# Patient Record
Sex: Male | Born: 1935 | Race: White | Hispanic: No | Smoking: Former smoker
Health system: Southern US, Community
[De-identification: ages and names within clinical notes are randomized; demographics above are authoritative.]

## PROBLEM LIST (undated history)

## (undated) DIAGNOSIS — F0390 Unspecified dementia without behavioral disturbance: Secondary | ICD-10-CM

## (undated) DIAGNOSIS — M797 Fibromyalgia: Secondary | ICD-10-CM

## (undated) DIAGNOSIS — K219 Gastro-esophageal reflux disease without esophagitis: Secondary | ICD-10-CM

## (undated) DIAGNOSIS — F419 Anxiety disorder, unspecified: Secondary | ICD-10-CM

## (undated) DIAGNOSIS — G20 Parkinson's disease: Secondary | ICD-10-CM

## (undated) DIAGNOSIS — I639 Cerebral infarction, unspecified: Secondary | ICD-10-CM

## (undated) DIAGNOSIS — I2510 Atherosclerotic heart disease of native coronary artery without angina pectoris: Secondary | ICD-10-CM

## (undated) DIAGNOSIS — E785 Hyperlipidemia, unspecified: Secondary | ICD-10-CM

## (undated) DIAGNOSIS — J449 Chronic obstructive pulmonary disease, unspecified: Secondary | ICD-10-CM

## (undated) DIAGNOSIS — K920 Hematemesis: Secondary | ICD-10-CM

## (undated) DIAGNOSIS — E039 Hypothyroidism, unspecified: Secondary | ICD-10-CM

## (undated) DIAGNOSIS — A0472 Enterocolitis due to Clostridium difficile, not specified as recurrent: Secondary | ICD-10-CM

## (undated) DIAGNOSIS — R1310 Dysphagia, unspecified: Secondary | ICD-10-CM

## (undated) DIAGNOSIS — N390 Urinary tract infection, site not specified: Secondary | ICD-10-CM

## (undated) HISTORY — PX: CATARACT EXTRACTION: SUR2

---

## 2001-05-29 HISTORY — PX: BACK SURGERY: SHX140

## 2009-04-01 ENCOUNTER — Ambulatory Visit: Payer: Self-pay | Admitting: Family Medicine

## 2010-05-29 DIAGNOSIS — I639 Cerebral infarction, unspecified: Secondary | ICD-10-CM

## 2010-05-29 HISTORY — DX: Cerebral infarction, unspecified: I63.9

## 2011-03-17 ENCOUNTER — Inpatient Hospital Stay: Payer: Self-pay | Admitting: Internal Medicine

## 2011-03-17 DIAGNOSIS — I6789 Other cerebrovascular disease: Secondary | ICD-10-CM

## 2011-04-14 ENCOUNTER — Inpatient Hospital Stay: Payer: Self-pay | Admitting: Internal Medicine

## 2011-05-08 ENCOUNTER — Observation Stay: Payer: Self-pay | Admitting: Internal Medicine

## 2011-06-28 ENCOUNTER — Observation Stay: Payer: Self-pay | Admitting: Internal Medicine

## 2011-06-28 LAB — URINALYSIS, COMPLETE
Bacteria: NONE SEEN
Bilirubin,UR: NEGATIVE
Blood: NEGATIVE
Nitrite: NEGATIVE
Ph: 6 (ref 4.5–8.0)
RBC,UR: 1 /HPF (ref 0–5)
Specific Gravity: 1.021 (ref 1.003–1.030)
Squamous Epithelial: <1
WBC UR: <1 /HPF (ref 0–5)

## 2011-06-28 LAB — COMPREHENSIVE METABOLIC PANEL
Albumin: 3.8 g/dL (ref 3.4–5.0)
Alkaline Phosphatase: 63 U/L (ref 50–136)
Calcium, Total: 9 mg/dL (ref 8.5–10.1)
Co2: 28 mmol/L (ref 21–32)
EGFR (Non-African Amer.): >60
Glucose: 105 mg/dL — ABNORMAL HIGH (ref 65–99)
SGOT(AST): 19 U/L (ref 15–37)
SGPT (ALT): 10 U/L — ABNORMAL LOW
Total Protein: 7.8 g/dL (ref 6.4–8.2)

## 2011-06-28 LAB — CBC
HCT: 42.3 % (ref 40.0–52.0)
HGB: 14.3 g/dL (ref 13.0–18.0)
MCH: 31.7 pg (ref 26.0–34.0)
MCHC: 33.8 g/dL (ref 32.0–36.0)
RBC: 4.52 10*6/uL (ref 4.40–5.90)

## 2011-06-29 LAB — CBC WITH DIFFERENTIAL/PLATELET
Basophil #: 0.1 10*3/uL (ref 0.0–0.1)
HCT: 42.2 % (ref 40.0–52.0)
HGB: 14.6 g/dL (ref 13.0–18.0)
Lymphocyte %: 25.9 %
Monocyte #: 0.8 10*3/uL — ABNORMAL HIGH (ref 0.0–0.7)
Monocyte %: 10.5 %
Neutrophil %: 59.6 %
Platelet: 188 10*3/uL (ref 150–440)
RBC: 4.52 10*6/uL (ref 4.40–5.90)
RDW: 12.7 % (ref 11.5–14.5)
WBC: 7.8 10*3/uL (ref 3.8–10.6)

## 2011-06-29 LAB — BASIC METABOLIC PANEL
BUN: 9 mg/dL (ref 7–18)
Calcium, Total: 8.6 mg/dL (ref 8.5–10.1)
Creatinine: 0.64 mg/dL (ref 0.60–1.30)
EGFR (African American): >60
EGFR (Non-African Amer.): >60
Glucose: 89 mg/dL (ref 65–99)
Potassium: 3.9 mmol/L (ref 3.5–5.1)
Sodium: 142 mmol/L (ref 136–145)

## 2012-10-29 ENCOUNTER — Ambulatory Visit: Payer: Self-pay | Admitting: Otolaryngology

## 2012-11-09 ENCOUNTER — Other Ambulatory Visit: Payer: Self-pay | Admitting: Family Medicine

## 2012-11-09 LAB — URINALYSIS, COMPLETE
Bacteria: NONE SEEN
Blood: NEGATIVE
Glucose,UR: NEGATIVE mg/dL (ref 0–75)
Leukocyte Esterase: NEGATIVE
Nitrite: NEGATIVE
RBC,UR: <1 /HPF (ref 0–5)
Specific Gravity: 1.03 (ref 1.003–1.030)
Squamous Epithelial: <1
WBC UR: 2 /HPF (ref 0–5)

## 2012-11-09 LAB — COMPREHENSIVE METABOLIC PANEL
Albumin: 3.9 g/dL (ref 3.4–5.0)
Anion Gap: 8 (ref 7–16)
Calcium, Total: 8.6 mg/dL (ref 8.5–10.1)
Chloride: 106 mmol/L (ref 98–107)
Co2: 27 mmol/L (ref 21–32)
Creatinine: 0.7 mg/dL (ref 0.60–1.30)
EGFR (Non-African Amer.): >60
Glucose: 49 mg/dL — ABNORMAL LOW (ref 65–99)
Osmolality: 279 (ref 275–301)
Potassium: 5.3 mmol/L — ABNORMAL HIGH (ref 3.5–5.1)
SGOT(AST): 21 U/L (ref 15–37)

## 2012-11-09 LAB — CBC WITH DIFFERENTIAL/PLATELET
Basophil #: 0 10*3/uL (ref 0.0–0.1)
Basophil %: 0.6 %
HCT: 40.8 % (ref 40.0–52.0)
Lymphocyte #: 1.9 10*3/uL (ref 1.0–3.6)
MCHC: 34.6 g/dL (ref 32.0–36.0)
MCV: 92 fL (ref 80–100)
Monocyte #: 0.8 x10 3/mm (ref 0.2–1.0)
Monocyte %: 9.6 %
Neutrophil #: 5 10*3/uL (ref 1.4–6.5)
Neutrophil %: 63.5 %
RBC: 4.46 10*6/uL (ref 4.40–5.90)
RDW: 13.5 % (ref 11.5–14.5)
WBC: 7.9 10*3/uL (ref 3.8–10.6)

## 2012-11-09 LAB — VALPROIC ACID LEVEL: Valproic Acid: 29 ug/mL — ABNORMAL LOW

## 2013-06-12 ENCOUNTER — Emergency Department: Payer: Self-pay | Admitting: Emergency Medicine

## 2013-06-12 LAB — COMPREHENSIVE METABOLIC PANEL
ALBUMIN: 3.4 g/dL (ref 3.4–5.0)
ALK PHOS: 84 U/L
ANION GAP: 6 — AB (ref 7–16)
AST: 16 U/L (ref 15–37)
BILIRUBIN TOTAL: 0.5 mg/dL (ref 0.2–1.0)
BUN: 10 mg/dL (ref 7–18)
CREATININE: 0.75 mg/dL (ref 0.60–1.30)
Calcium, Total: 8.5 mg/dL (ref 8.5–10.1)
Chloride: 103 mmol/L (ref 98–107)
Co2: 28 mmol/L (ref 21–32)
EGFR (Non-African Amer.): >60
Glucose: 121 mg/dL — ABNORMAL HIGH (ref 65–99)
Osmolality: 274 (ref 275–301)
POTASSIUM: 3.9 mmol/L (ref 3.5–5.1)
SGPT (ALT): 9 U/L — ABNORMAL LOW (ref 12–78)
Sodium: 137 mmol/L (ref 136–145)
TOTAL PROTEIN: 7.7 g/dL (ref 6.4–8.2)

## 2013-06-12 LAB — URINALYSIS, COMPLETE
BILIRUBIN, UR: NEGATIVE
BLOOD: NEGATIVE
Bacteria: NONE SEEN
Glucose,UR: NEGATIVE mg/dL (ref 0–75)
Leukocyte Esterase: NEGATIVE
NITRITE: NEGATIVE
PROTEIN: NEGATIVE
Ph: 5 (ref 4.5–8.0)
RBC,UR: 1 /HPF (ref 0–5)
SPECIFIC GRAVITY: 1.017 (ref 1.003–1.030)
Squamous Epithelial: NONE SEEN
WBC UR: 5 /HPF (ref 0–5)

## 2013-06-12 LAB — CBC
HCT: 40.6 % (ref 40.0–52.0)
HGB: 14.3 g/dL (ref 13.0–18.0)
MCH: 32.7 pg (ref 26.0–34.0)
MCHC: 35.1 g/dL (ref 32.0–36.0)
MCV: 93 fL (ref 80–100)
Platelet: 202 10*3/uL (ref 150–440)
RBC: 4.36 10*6/uL — ABNORMAL LOW (ref 4.40–5.90)
RDW: 13.8 % (ref 11.5–14.5)
WBC: 7.3 10*3/uL (ref 3.8–10.6)

## 2013-06-12 LAB — ETHANOL: Ethanol %: <0.003 % (ref 0.000–0.080)

## 2013-06-12 LAB — TROPONIN I: Troponin-I: <0.02 ng/mL

## 2013-06-18 ENCOUNTER — Observation Stay: Payer: Self-pay | Admitting: Internal Medicine

## 2013-06-18 LAB — COMPREHENSIVE METABOLIC PANEL
ALT: 10 U/L — AB (ref 12–78)
AST: 36 U/L (ref 15–37)
Albumin: 3.9 g/dL (ref 3.4–5.0)
Alkaline Phosphatase: 67 U/L
Anion Gap: 7 (ref 7–16)
BILIRUBIN TOTAL: 0.5 mg/dL (ref 0.2–1.0)
BUN: 13 mg/dL (ref 7–18)
CO2: 28 mmol/L (ref 21–32)
CREATININE: 0.82 mg/dL (ref 0.60–1.30)
Calcium, Total: 9.1 mg/dL (ref 8.5–10.1)
Chloride: 104 mmol/L (ref 98–107)
Glucose: 88 mg/dL (ref 65–99)
OSMOLALITY: 277 (ref 275–301)
Potassium: 4.5 mmol/L (ref 3.5–5.1)
Sodium: 139 mmol/L (ref 136–145)
Total Protein: 8.1 g/dL (ref 6.4–8.2)

## 2013-06-18 LAB — URINALYSIS, COMPLETE
BACTERIA: NONE SEEN
BLOOD: NEGATIVE
Bilirubin,UR: NEGATIVE
GLUCOSE, UR: NEGATIVE mg/dL (ref 0–75)
Leukocyte Esterase: NEGATIVE
Nitrite: NEGATIVE
Ph: 7 (ref 4.5–8.0)
Protein: NEGATIVE
RBC,UR: 1 /HPF (ref 0–5)
SPECIFIC GRAVITY: 1.015 (ref 1.003–1.030)
Squamous Epithelial: NONE SEEN
WBC UR: 4 /HPF (ref 0–5)

## 2013-06-18 LAB — CBC WITH DIFFERENTIAL/PLATELET
BASOS PCT: 0.4 %
Basophil #: 0.1 10*3/uL (ref 0.0–0.1)
Eosinophil #: 0.1 10*3/uL (ref 0.0–0.7)
Eosinophil %: 0.7 %
HCT: 44.3 % (ref 40.0–52.0)
HGB: 14.7 g/dL (ref 13.0–18.0)
Lymphocyte #: 1.2 10*3/uL (ref 1.0–3.6)
Lymphocyte %: 9 %
MCH: 31.6 pg (ref 26.0–34.0)
MCHC: 33.2 g/dL (ref 32.0–36.0)
MCV: 95 fL (ref 80–100)
Monocyte #: 1 x10 3/mm (ref 0.2–1.0)
Monocyte %: 7 %
Neutrophil #: 11.3 10*3/uL — ABNORMAL HIGH (ref 1.4–6.5)
Neutrophil %: 82.9 %
Platelet: 208 10*3/uL (ref 150–440)
RBC: 4.66 10*6/uL (ref 4.40–5.90)
RDW: 13.7 % (ref 11.5–14.5)
WBC: 13.7 10*3/uL — ABNORMAL HIGH (ref 3.8–10.6)

## 2013-06-18 LAB — PHENYTOIN LEVEL, TOTAL: Dilantin: 8.5 ug/mL — ABNORMAL LOW (ref 10.0–20.0)

## 2013-06-19 LAB — CBC WITH DIFFERENTIAL/PLATELET
BASOS ABS: 0.1 10*3/uL (ref 0.0–0.1)
Basophil %: 0.6 %
Eosinophil #: 0.1 10*3/uL (ref 0.0–0.7)
Eosinophil %: 1 %
HCT: 42.1 % (ref 40.0–52.0)
HGB: 14.1 g/dL (ref 13.0–18.0)
Lymphocyte #: 1.2 10*3/uL (ref 1.0–3.6)
Lymphocyte %: 11.9 %
MCH: 31.9 pg (ref 26.0–34.0)
MCHC: 33.6 g/dL (ref 32.0–36.0)
MCV: 95 fL (ref 80–100)
Monocyte #: 0.9 x10 3/mm (ref 0.2–1.0)
Monocyte %: 9.2 %
NEUTROS ABS: 7.8 10*3/uL — AB (ref 1.4–6.5)
NEUTROS PCT: 77.3 %
Platelet: 207 10*3/uL (ref 150–440)
RBC: 4.43 10*6/uL (ref 4.40–5.90)
RDW: 14.1 % (ref 11.5–14.5)
WBC: 10.1 10*3/uL (ref 3.8–10.6)

## 2013-06-19 LAB — URINE CULTURE

## 2013-06-19 LAB — BASIC METABOLIC PANEL
Anion Gap: 8 (ref 7–16)
BUN: 10 mg/dL (ref 7–18)
CALCIUM: 8.8 mg/dL (ref 8.5–10.1)
CHLORIDE: 104 mmol/L (ref 98–107)
CREATININE: 0.77 mg/dL (ref 0.60–1.30)
Co2: 25 mmol/L (ref 21–32)
EGFR (Non-African Amer.): >60
Glucose: 88 mg/dL (ref 65–99)
OSMOLALITY: 272 (ref 275–301)
Potassium: 3.8 mmol/L (ref 3.5–5.1)
SODIUM: 137 mmol/L (ref 136–145)

## 2013-06-19 LAB — MAGNESIUM: MAGNESIUM: 2 mg/dL

## 2013-06-20 LAB — PHENYTOIN LEVEL, TOTAL: DILANTIN: 8.1 ug/mL — AB (ref 10.0–20.0)

## 2013-06-21 ENCOUNTER — Emergency Department: Payer: Self-pay | Admitting: Emergency Medicine

## 2013-06-21 ENCOUNTER — Ambulatory Visit: Payer: Self-pay | Admitting: Neurology

## 2013-06-21 LAB — COMPREHENSIVE METABOLIC PANEL
ALK PHOS: 68 U/L
Albumin: 3.5 g/dL (ref 3.4–5.0)
Anion Gap: 7 (ref 7–16)
BUN: 13 mg/dL (ref 7–18)
Bilirubin,Total: 0.4 mg/dL (ref 0.2–1.0)
Calcium, Total: 8.7 mg/dL (ref 8.5–10.1)
Chloride: 106 mmol/L (ref 98–107)
Co2: 27 mmol/L (ref 21–32)
Creatinine: 1 mg/dL (ref 0.60–1.30)
EGFR (African American): >60
Glucose: 156 mg/dL — ABNORMAL HIGH (ref 65–99)
Osmolality: 283 (ref 275–301)
Potassium: 3.6 mmol/L (ref 3.5–5.1)
SGOT(AST): 39 U/L — ABNORMAL HIGH (ref 15–37)
SGPT (ALT): 23 U/L (ref 12–78)
Sodium: 140 mmol/L (ref 136–145)
Total Protein: 7.6 g/dL (ref 6.4–8.2)

## 2013-06-21 LAB — CBC
HCT: 41.8 % (ref 40.0–52.0)
HGB: 14.1 g/dL (ref 13.0–18.0)
MCH: 32.2 pg (ref 26.0–34.0)
MCHC: 33.8 g/dL (ref 32.0–36.0)
MCV: 95 fL (ref 80–100)
PLATELETS: 211 10*3/uL (ref 150–440)
RBC: 4.38 10*6/uL — AB (ref 4.40–5.90)
RDW: 14 % (ref 11.5–14.5)
WBC: 9.6 10*3/uL (ref 3.8–10.6)

## 2013-06-21 LAB — DRUG SCREEN, URINE
Amphetamines, Ur Screen: NEGATIVE (ref ?–1000)
BARBITURATES, UR SCREEN: NEGATIVE (ref ?–200)
Benzodiazepine, Ur Scrn: POSITIVE (ref ?–200)
Cannabinoid 50 Ng, Ur ~~LOC~~: NEGATIVE (ref ?–50)
Cocaine Metabolite,Ur ~~LOC~~: NEGATIVE (ref ?–300)
MDMA (Ecstasy)Ur Screen: NEGATIVE (ref ?–500)
METHADONE, UR SCREEN: NEGATIVE (ref ?–300)
OPIATE, UR SCREEN: POSITIVE (ref ?–300)
PHENCYCLIDINE (PCP) UR S: NEGATIVE (ref ?–25)
Tricyclic, Ur Screen: NEGATIVE (ref ?–1000)

## 2013-06-21 LAB — URINALYSIS, COMPLETE
Bilirubin,UR: NEGATIVE
Glucose,UR: NEGATIVE mg/dL (ref 0–75)
Hyaline Cast: 6
Leukocyte Esterase: NEGATIVE
Nitrite: NEGATIVE
PH: 5 (ref 4.5–8.0)
Protein: 30
RBC,UR: 1 /HPF (ref 0–5)
Specific Gravity: 1.027 (ref 1.003–1.030)
Squamous Epithelial: <1
WBC UR: 6 /HPF (ref 0–5)

## 2013-06-21 LAB — ETHANOL: Ethanol: <3 mg/dL

## 2013-06-21 LAB — VALPROIC ACID LEVEL: Valproic Acid: 13 ug/mL — ABNORMAL LOW

## 2013-06-21 LAB — PHENYTOIN LEVEL, TOTAL: Dilantin: 9.1 ug/mL — ABNORMAL LOW (ref 10.0–20.0)

## 2013-07-27 DIAGNOSIS — A0472 Enterocolitis due to Clostridium difficile, not specified as recurrent: Secondary | ICD-10-CM

## 2013-07-27 DIAGNOSIS — R1310 Dysphagia, unspecified: Secondary | ICD-10-CM

## 2013-07-27 HISTORY — DX: Enterocolitis due to Clostridium difficile, not specified as recurrent: A04.72

## 2013-07-27 HISTORY — DX: Dysphagia, unspecified: R13.10

## 2013-08-07 ENCOUNTER — Other Ambulatory Visit: Payer: Self-pay

## 2013-08-07 ENCOUNTER — Other Ambulatory Visit: Payer: Self-pay | Admitting: Family Medicine

## 2013-08-07 LAB — URINALYSIS, COMPLETE
BACTERIA: NONE SEEN
BLOOD: NEGATIVE
Bilirubin,UR: NEGATIVE
Glucose,UR: NEGATIVE mg/dL (ref 0–75)
Hyaline Cast: 6
Leukocyte Esterase: NEGATIVE
Nitrite: NEGATIVE
Ph: 5 (ref 4.5–8.0)
Protein: 30
RBC,UR: 4 /HPF (ref 0–5)
Specific Gravity: 1.028 (ref 1.003–1.030)
Squamous Epithelial: <1
WBC UR: 5 /HPF (ref 0–5)

## 2013-08-07 LAB — CBC WITH DIFFERENTIAL/PLATELET
Basophil #: 0 10*3/uL (ref 0.0–0.1)
Basophil %: 0.7 %
EOS ABS: 0.2 10*3/uL (ref 0.0–0.7)
Eosinophil %: 3.4 %
HCT: 41.4 % (ref 40.0–52.0)
HGB: 13.3 g/dL (ref 13.0–18.0)
LYMPHS PCT: 24.8 %
Lymphocyte #: 1.1 10*3/uL (ref 1.0–3.6)
MCH: 31.5 pg (ref 26.0–34.0)
MCHC: 32 g/dL (ref 32.0–36.0)
MCV: 98 fL (ref 80–100)
MONO ABS: 0.5 x10 3/mm (ref 0.2–1.0)
MONOS PCT: 10.3 %
Neutrophil #: 2.7 10*3/uL (ref 1.4–6.5)
Neutrophil %: 60.8 %
Platelet: 185 10*3/uL (ref 150–440)
RBC: 4.21 10*6/uL — AB (ref 4.40–5.90)
RDW: 14.5 % (ref 11.5–14.5)
WBC: 4.5 10*3/uL (ref 3.8–10.6)

## 2013-08-07 LAB — COMPREHENSIVE METABOLIC PANEL
ALT: 13 U/L (ref 12–78)
Albumin: 3.6 g/dL (ref 3.4–5.0)
Alkaline Phosphatase: 59 U/L
Anion Gap: 4 — ABNORMAL LOW (ref 7–16)
BUN: 9 mg/dL (ref 7–18)
Bilirubin,Total: 0.4 mg/dL (ref 0.2–1.0)
CO2: 31 mmol/L (ref 21–32)
Calcium, Total: 8.6 mg/dL (ref 8.5–10.1)
Chloride: 106 mmol/L (ref 98–107)
Creatinine: 0.92 mg/dL (ref 0.60–1.30)
EGFR (African American): >60
GLUCOSE: 97 mg/dL (ref 65–99)
Osmolality: 280 (ref 275–301)
Potassium: 3.9 mmol/L (ref 3.5–5.1)
SGOT(AST): 23 U/L (ref 15–37)
Sodium: 141 mmol/L (ref 136–145)
TOTAL PROTEIN: 6.9 g/dL (ref 6.4–8.2)

## 2013-08-07 LAB — VALPROIC ACID LEVEL: Valproic Acid: 54 ug/mL

## 2013-08-09 LAB — URINE CULTURE

## 2013-08-18 ENCOUNTER — Emergency Department: Payer: Self-pay | Admitting: Emergency Medicine

## 2013-08-18 LAB — CBC
HCT: 41.8 % (ref 40.0–52.0)
HGB: 14.2 g/dL (ref 13.0–18.0)
MCH: 32.5 pg (ref 26.0–34.0)
MCHC: 34.1 g/dL (ref 32.0–36.0)
MCV: 95 fL (ref 80–100)
Platelet: 208 10*3/uL (ref 150–440)
RBC: 4.38 10*6/uL — AB (ref 4.40–5.90)
RDW: 14.2 % (ref 11.5–14.5)
WBC: 8.8 10*3/uL (ref 3.8–10.6)

## 2013-08-18 LAB — MAGNESIUM: Magnesium: 2 mg/dL

## 2013-08-18 LAB — URINALYSIS, COMPLETE
BACTERIA: NONE SEEN
BLOOD: NEGATIVE
Bilirubin,UR: NEGATIVE
Glucose,UR: NEGATIVE mg/dL (ref 0–75)
Hyaline Cast: 5
Leukocyte Esterase: NEGATIVE
Nitrite: NEGATIVE
PH: 6 (ref 4.5–8.0)
Protein: 30
SPECIFIC GRAVITY: 1.023 (ref 1.003–1.030)
Squamous Epithelial: NONE SEEN
WBC UR: 1 /HPF (ref 0–5)

## 2013-08-18 LAB — HEPATIC FUNCTION PANEL A (ARMC)
Albumin: 3.7 g/dL (ref 3.4–5.0)
Alkaline Phosphatase: 63 U/L
Bilirubin, Direct: 0.1 mg/dL (ref 0.00–0.20)
Bilirubin,Total: 0.5 mg/dL (ref 0.2–1.0)
SGOT(AST): 25 U/L (ref 15–37)
SGPT (ALT): 13 U/L (ref 12–78)
Total Protein: 7.5 g/dL (ref 6.4–8.2)

## 2013-08-18 LAB — BASIC METABOLIC PANEL
Anion Gap: 8 (ref 7–16)
BUN: 9 mg/dL (ref 7–18)
CALCIUM: 8.7 mg/dL (ref 8.5–10.1)
Chloride: 105 mmol/L (ref 98–107)
Co2: 25 mmol/L (ref 21–32)
Creatinine: 0.97 mg/dL (ref 0.60–1.30)
EGFR (Non-African Amer.): >60
Glucose: 101 mg/dL — ABNORMAL HIGH (ref 65–99)
Osmolality: 275 (ref 275–301)
Potassium: 3.1 mmol/L — ABNORMAL LOW (ref 3.5–5.1)
Sodium: 138 mmol/L (ref 136–145)

## 2013-08-18 LAB — TROPONIN I

## 2013-08-18 LAB — LIPASE, BLOOD: LIPASE: 228 U/L (ref 73–393)

## 2013-09-30 ENCOUNTER — Ambulatory Visit: Payer: Self-pay | Admitting: Family Medicine

## 2013-10-01 ENCOUNTER — Other Ambulatory Visit: Payer: Self-pay | Admitting: Family Medicine

## 2013-10-01 LAB — URINALYSIS, COMPLETE
BILIRUBIN, UR: NEGATIVE
Bacteria: NONE SEEN
Blood: NEGATIVE
Glucose,UR: NEGATIVE mg/dL (ref 0–75)
Leukocyte Esterase: NEGATIVE
Nitrite: NEGATIVE
PH: 6 (ref 4.5–8.0)
Protein: NEGATIVE
RBC,UR: NONE SEEN /HPF (ref 0–5)
Specific Gravity: 1.015 (ref 1.003–1.030)
Squamous Epithelial: NONE SEEN
WBC UR: NONE SEEN /HPF (ref 0–5)

## 2013-10-03 LAB — URINE CULTURE

## 2014-01-16 ENCOUNTER — Emergency Department: Payer: Self-pay | Admitting: Internal Medicine

## 2014-01-16 LAB — TROPONIN I: Troponin-I: <0.02 ng/mL

## 2014-01-16 LAB — COMPREHENSIVE METABOLIC PANEL
ALBUMIN: 3.7 g/dL (ref 3.4–5.0)
ALK PHOS: 80 U/L
Anion Gap: 4 — ABNORMAL LOW (ref 7–16)
BUN: 15 mg/dL (ref 7–18)
Bilirubin,Total: 0.4 mg/dL (ref 0.2–1.0)
CALCIUM: 9.3 mg/dL (ref 8.5–10.1)
CREATININE: 0.88 mg/dL (ref 0.60–1.30)
Chloride: 110 mmol/L — ABNORMAL HIGH (ref 98–107)
Co2: 34 mmol/L — ABNORMAL HIGH (ref 21–32)
EGFR (African American): >60
Glucose: 116 mg/dL — ABNORMAL HIGH (ref 65–99)
Osmolality: 296 (ref 275–301)
Potassium: 3.9 mmol/L (ref 3.5–5.1)
SGOT(AST): 20 U/L (ref 15–37)
SGPT (ALT): 10 U/L — ABNORMAL LOW
Sodium: 148 mmol/L — ABNORMAL HIGH (ref 136–145)
TOTAL PROTEIN: 7.8 g/dL (ref 6.4–8.2)

## 2014-01-16 LAB — PHENYTOIN LEVEL, TOTAL: Dilantin: 7.5 ug/mL — ABNORMAL LOW (ref 10.0–20.0)

## 2014-01-16 LAB — PROTIME-INR
INR: 1.1
PROTHROMBIN TIME: 14.2 s (ref 11.5–14.7)

## 2014-01-16 LAB — CK TOTAL AND CKMB (NOT AT ARMC)
CK, TOTAL: 36 U/L — AB
CK-MB: 1.2 ng/mL (ref 0.5–3.6)

## 2014-01-16 LAB — CBC
HCT: 46.6 % (ref 40.0–52.0)
HGB: 15 g/dL (ref 13.0–18.0)
MCH: 30.5 pg (ref 26.0–34.0)
MCHC: 32.3 g/dL (ref 32.0–36.0)
MCV: 94 fL (ref 80–100)
Platelet: 213 10*3/uL (ref 150–440)
RBC: 4.93 10*6/uL (ref 4.40–5.90)
RDW: 13.8 % (ref 11.5–14.5)
WBC: 7.3 10*3/uL (ref 3.8–10.6)

## 2014-09-19 NOTE — Discharge Summary (Signed)
PATIENT NAME:  Nicholas PalmaFOGLEMAN, Jethro E MR#:  [id number] DATE OF BIRTH:  1936/02/27  DATE OF ADMISSION:  06/18/2013 DATE OF DISCHARGE:  06/20/2013  DISCHARGE DIAGNOSES: 1.  Seizures secondary to subtherapeutic phenytoin levels.  2.  Postictal encephalopathy.  3.  Delirium over dementia.   IMAGING STUDIES: Include a CT scan of the head without contrast which showed no acute infarction, mass, or bleed. He did have mild soft tissue swelling over the right frontal calvarium. Did how significant atrophy.   CT of the cervical spine without contrast showed no dislocation or fracture. It did show significant degenerative disease.   MRI of the brain without contrast showed chronic small vessel ischemic disease. Cerebral atrophy. No acute abnormalities or strokes.   CONSULTS: Dr. Malvin JohnsPotter with neurology.   ADMITTING HISTORY AND PHYSICAL AND HOSPITAL COURSE: Please see detailed H and P dictated by Dr. Randol KernElgergawy.  In brief, a 79 year old Caucasian male patient with history of Parkinson's, CVA, dementia, and recent seizures who presented to the hospital with recurrent seizures and confusion. The patient was found to have subtherapeutic Dilantin levels, admitted to the hospitalist service for further treatment and work-up. The patient had a CT scan of the head and MRI of the brain done which showed no significant abnormalities other than chronic small vessel disease and cerebral atrophy. The patient was seen by Dr. Malvin JohnsPotter who suggested increasing his phenytoin level to 400 mg daily after which the patient was switched to 200 mg twice a day orally. The patient has not had any seizures in the hospital. He did have prolonged postictal encephalopathy which has resolved. I have discussed the case with his wife at bedside on the day of discharge and she feels that the patient is back to his baseline. He does have significant dementia, has episodes of confusion.   Today on examination, the patient is alert to person but not to  place or time. Moves all 4 extremities symmetrically.   DISCHARGE MEDICATIONS: Include:  1.  Norvasc 2.5 mg daily.  2.  Aspirin 81 mg daily.  3.  Plavix 75 mg daily.  4.  Prilosec 40 mg daily.  5.  Mylanta 30 mL every 2 hours as needed.  6.  Zoloft 100 mg daily.  7.  Xanax 0.5 mg oral 2 times a day as needed for agitation.  8.  Requip 1 mg oral 3 times a day.  9.  Aricept 10 mg daily at bedtime.  10.  Carbidopa/levodopa 50/200 mg 1 tablet 2 times a day.  11.  Depakote 250 mg in the morning and 250 mg at bedtime.  12.  Melatonin 3 mg oral once at bedtime.  13.  Norco 325/5 mg 1 tablet 2 times a day as needed for pain.  14.  Dilantin 200 mg oral 2 times a day.   DISCHARGE INSTRUCTIONS: Low-sodium diet. Activity as tolerated with assistance. The patient will be on a pureed, nectar thickened liquid diet. Follow up with Dr. Sherryll BurgerShah with neurology in 1 to 2 weeks. Also add Magic cup 3 times a day with the patient's meals.   TIME SPENT: On day of discharge in discharge activity was 35 minutes. ____________________________ Molinda BailiffSrikar R. Christella App, MD srs:sb D: 06/20/2013 14:56:36 ET T: 06/20/2013 15:06:01 ET JOB#: [id number]  cc: Wardell HeathSrikar R. Kamisha Ell, MD, <Dictator> Hemang K. Sherryll BurgerShah, MD Orie FishermanSRIKAR R Hassan Blackshire MD ELECTRONICALLY SIGNED 06/24/2013 10:17

## 2014-09-19 NOTE — Consult Note (Signed)
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  He was recently admitted to [facility] with a prior episode of seizures as well during which time he was started on Dilantin.  In the ED the patient was seen to be very agitated and was very haldol and ativan.  His dilantin level was found to be low at Nicholas.5.  He had a HCT performed which was unremarkable.  Since his seizures, per family at bedside, the patient has been rather agitated and also drowsy.  He is not back to his current level of cognitive function.  No new or additional seizure activity witnessed since admission.  No clear cause has been identified for his seizures.  No seizures before last week.  At baseline he doesnt know the year but does recognize his wife and know her name.  Unable to perform due to AMS. MEDICAL HISTORY: History of CVA.  Hypertension.  COPD.  Anxiety and depression.  Parkinson disease with tremors. History of dementia.  Hyperlipidemia.  GERD.  SURGICAL HISTORY: Back surgery.  Cataract surgery. HISTORY:  patient lives at a skilled nursing facility.  quit smoking in 2012.  alcohol.  recreational drug use.  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  MEDICATIONS: Aspirin 81 mg daily.  Norco 2 times a day as needed.  Ultracet as needed.  MiraLAX as needed.  Mylanta as needed.  Dilantin 300 mg oral at bedtime.  Depakote sprinkles 250 mg oral daily.  Zoloft 100 mg oral daily.  Carbidopa, levodopa 50/200 extended release 2 times a day.  Requip 1 mg oral 3 times a day.  Plavix 75 mg oral daily.  Xanax 0.5 mg oral 2 times a day as needed.  Norvasc 2.5 mg oral daily.  Aricept 10 mg oral at bedtime.  Cleocin 300 mg oral every eight  hours.  Melatonin 3 mg oral at bedtime.  Prilosec 40 mg oral daily.   ALLERGIES:      (Removed):  Past Medical/Surgical Hx:  depression:   anxiety:   CVA: 02/2011  COPD:   Parkinson's Dis.:   dysphagia: dx'd previous admission  Cataract Extraction:   Back Surgery:   Home Medications: Medication Instructions Last Modified Date/Time  Cleocin HCl 300 mg oral capsule 1 cap(s) orally every Nicholas hours 21-Jan-15 01:56  Dilantin 100 mg oral capsule, extended release 3 cap(s) orally once a day (at bedtime) 21-Jan-15 01:56  Mylanta 30 milliliter(s) orally every 2 hours, As Needed - for Indigestion, Heartburn 21-Jan-15 01:56  Zoloft 100 mg oral tablet 1 tab(s) orally once a day 21-Jan-15 01:56  Xanax 0.5 mg oral tablet 1 tab(s) orally 2 times a day, As Needed - for Anxiety, Nervousness 21-Jan-15 01:56  Requip 1 mg oral tablet 1 tab(s) orally 3 times a day 21-Jan-15 01:56  Ultracet 325 mg-37.5 mg oral tablet  orally 2 times a day 21-Jan-15 01:56  Ultracet 325 mg-37.5 mg oral tablet  orally every 6 hours, As Needed - for Pain 21-Jan-15 01:56  Aricept 10 mg oral tablet 1 tab(s) orally once a day (at bedtime) 21-Jan-15 01:56  carbidopa-levodopa 50 mg-200 mg oral tablet, extended release  orally 2 times a day 21-Jan-15 01:56  Depakote Sprinkles 125 mg oral delayed release capsule 2 cap(s) orally once a day (at bedtime) 21-Jan-15 01:56  Depakote Sprinkles 250 milligram(s) orally once a day 21-Jan-15 01:56  Melatonin 3 mg oral tablet 1 tab(s) orally once (at bedtime) 21-Jan-15 01:56  Milantex Maximum Strength 400 mg-400 mg-40 mg/5 mL oral suspension 30 milliliter(s) orally every Nicholas hours, As Needed - for Indigestion, Heartburn 21-Jan-15 01:56  Norco 325 mg-5 mg oral tablet  orally 2 times a day, As Needed - for Pain 21-Jan-15 01:56  Norvasc 2.5 mg oral tablet 1 tab(s) orally once a day 21-Jan-15 01:56  aspirin 81 mg oral tablet 1 tab(s) orally once a day 21-Jan-15 01:56  Plavix 75 mg oral tablet 1  tab(s) orally once a day 21-Jan-15 01:56  Prilosec 40 mg oral delayed release capsule 1 cap(s) orally once a day 21-Jan-15 01:56   [facility] Current Meds:  Sodium Chloride 0.9%, 1000 ml at 75 ml/hr  Acetaminophen * tablet, ( Tylenol (325 mg) tablet)  650 mg Oral q4h PRN for pain or temp. greater than 100.4  - Indication: Pain/Fever  HePARin injection, 5000 unit(s), Subcutaneous, q8h  Indication: Anticoagulant, Monitor Anticoags per hospital protocol  MorphINE  injection, 2 to 4 mg, IV push, q4h PRN for pain  Indication: Pain, [Med Admin Window: 30 mins before or after scheduled dose]  Ondansetron injection, ( Zofran injection )  4 mg, IV push, q4h PRN for Nausea/Vomiting  Indication: Nausea/ Vomiting  Promethazine injection, ( Phenergan injection )  6.25 to 12.5 mg, Intramuscular, q4h PRN for nausea, vomiting  Indication: Antiemetic/ Motion Sickness/ Sedative/ Antihistamine  LORazepam injection,  ( Ativan injection )  2 mg, IV push, q4h PRN for seizure.  Stop After:  3 Doses  Indication: Anxiety/ Seizure/ Antiemetic Adjunct/ Preop Sedation  Haloperidol injection, ( Haldol injection )  1 to 2 mg, IV push, q4h PRN for agitation.  Stop After:  3 Doses  Indication: Psychosis/ Delirium, agitation  Nursing Saline Flush, 3 to 6 ml, IV push, Q1M PRN for IV Maintenance  Phenytoin injection, ( Dilantin injection )  125 mg in Sodium Chloride 0.9% 50 ml, IV Piggyback, q8h, Infuse over 15 minute(s)  Indication: Seizures, [Waste Code: Black], **Use an inline 0.22 micron filter**Flush line with 10 ml Normal Saline after each dose  DO NOT REFRIGERATE--ADMINISTER WITHIN 2 HRS AFTER PREPARATION  Allergies:  Penicillin: Unknown  Vital Signs: **Vital Signs.:   21-Jan-15 16:55  Vital Signs Type Admission  Temperature Temperature (F) 98.7  Celsius 37  Pulse Pulse 88  Respirations Respirations 20  Systolic BP Systolic BP 811  Diastolic BP (mmHg) Diastolic BP (mmHg) 72  Mean BP 94  Pulse Ox %  Pulse Ox % 93  Pulse Ox Activity Level  At rest  Oxygen Delivery Room Air/ 21 %    22:21  Vital Signs Type Routine  Temperature Temperature (F) 98.9  Celsius 37.1  Pulse Pulse 113  Respirations Respirations 19  Systolic BP Systolic BP 572  Diastolic BP (mmHg) Diastolic BP (mmHg) 82  Mean BP 105  Pulse Ox % Pulse Ox % 94  Pulse Ox Activity Level  At rest  Oxygen Delivery Room Air/ 21 %   EXAM: GENERAL: Confused.  Agitated.  Lethargic.  Sitter at bedside, wife at other side.  Normocephalic and atraumatic.  EYES: Funduscopic exam shows normal disc size, appearance and C/D ratio without clear evidence of papilledema.  CARDIOVASCULAR: S1 and S2 sounds are within normal limits, without murmurs, gallops, or rubs.  MUSCULOSKELETAL: Bulk - Normal Tone - Normal Pronator Drift - Does not follow commands due to AMS. Ambulation - On falls precautions, cannot test due to AMS. Moving all four extremities, at least 4+/5 throughout, does not follow commands due to AMS.  NEUROLOGICAL: MENTAL STATUS: Patient is oriented to name only.  Recent and remote memory are markedly diminished.  Attention span and concentration are markedly diminished.  Naming, repetition, comprehension and expressive speech are unremarkable.  Patient's fund of knowledge cannot be tested due to AMS.  CRANIAL NERVES: Does not follow commands for any visual fields testing.  SENSATION: Grossly intact to pain and temp bilaterally (spinothalamic tracts) Grossly intact to position and vibration bilaterally (dorsal columns)   REFLEXES: R/L 2+/2+    Biceps 2+/2+    Brachioradialis   2+/2+    Patellar 2+/2+    Achilles   COORDINATION/CEREBELLAR: Finger to nose testing cannot be performed due to AMS.  Lab Results:  Hepatic:  21-Jan-15 00:11   Bilirubin, Total 0.5  Alkaline Phosphatase 67 (45-117 NOTE: New Reference Range 04/18/13)  SGPT (ALT)  10  SGOT (AST) 36  Total Protein, Serum Nicholas.1  Albumin, Serum 3.9   TDMs:  21-Jan-15 00:11   Dilantin, Serum  Nicholas.5 (Result(s) reported on 18 Jun 2013 at 12:36AM.)  Routine Micro:  21-Jan-15 08:23   Micro Text Report URINE CULTURE   COMMENT                   NO GROWTH IN 18-24 HOURS   ANTIBIOTIC                       Specimen Source IN AND OUT CATH  Culture Comment NO GROWTH IN 18-24 HOURS  Result(s) reported on 19 Jun 2013 at 12:08PM.  Cardiology:  20-Jan-15 22:12   Ventricular Rate 95  Atrial Rate 95  P-R Interval 158  QRS Duration 84  QT 370  QTc 464  P Axis 74  R Axis -Nicholas  T Axis 58  ECG interpretation Normal sinus rhythm Inferior infarct , age undetermined Abnormal ECG When compared with ECG of 12-Jun-2013 19:27, Inferior infarct is now Present ----------unconfirmed---------- Confirmed by OVERREAD, NOT ([id number]), editor PEARSON, BARBARA ([id number]) on 06/18/2013 12:29:43 PM  Routine Chem:  21-Jan-15 00:11   Glucose, Serum 88  BUN 13  Creatinine (comp) 0.82  Sodium, Serum 139  Potassium, Serum 4.5  Chloride, Serum 104  CO2, Serum 28  Calcium (Total), Serum 9.1  Anion Gap 7  Osmolality (calc) 277  eGFR (African American) >60  eGFR (Non-African American) >60 (eGFR values <33mL/min/1.73 m2 may be an indication of chronic kidney disease (CKD). Calculated eGFR is useful in patients with stable renal function. The eGFR calculation will not be reliable in acutely ill patients when serum creatinine is changing rapidly. It is not useful in  patients on dialysis. The eGFR calculation may not be applicable to patients at the low and high extremes of body sizes, pregnant women, and vegetarians.)  Result Comment POTASSIUM/AST - Slight hemolysis, interpret results with  - caution.  Result(s) reported on 18 Jun 2013 at 01:03AM.  22-Jan-15 04:18   Glucose, Serum 88  BUN 10  Creatinine (comp) 0.77  Sodium, Serum 137  Potassium, Serum 3.Nicholas  Chloride, Serum 104  CO2, Serum 25  Calcium (Total), Serum Nicholas.Nicholas  Anion Gap Nicholas  Osmolality (calc) 272  eGFR  (African American) >60  eGFR (Non-African American) >60 (eGFR values <66mL/min/1.73 m2 may be an indication of  chronic kidney disease (CKD). Calculated eGFR is useful in patients with stable renal function. The eGFR calculation will not be reliable in acutely ill patients when serum creatinine is changing rapidly. It is not useful in  patients on dialysis. The eGFR calculation may not be applicable to patients at the low and high extremes of body sizes, pregnant women, and vegetarians.)  Magnesium, Serum 2.0 (1.Nicholas-2.4 THERAPEUTIC RANGE: 4-7 mg/dL TOXIC: > 10 mg/dL  -----------------------)  Routine UA:  21-Jan-15 08:23   Color (UA) Yellow  Clarity (UA) Clear  Glucose (UA) Negative  Bilirubin (UA) Negative  Ketones (UA) Trace  Specific Gravity (UA) 1.015  Blood (UA) Negative  pH (UA) 7.0  Protein (UA) Negative  Nitrite (UA) Negative  Leukocyte Esterase (UA) Negative (Result(s) reported on 18 Jun 2013 at 08:49AM.)  RBC (UA) 1 /HPF  WBC (UA) 4 /HPF  Bacteria (UA) NONE SEEN  Epithelial Cells (UA) NONE SEEN  Mucous (UA) PRESENT (Result(s) reported on 18 Jun 2013 at 08:49AM.)  Routine Hem:  21-Jan-15 00:11   WBC (CBC)  13.7  RBC (CBC) 4.66  Hemoglobin (CBC) 14.7  Hematocrit (CBC) 44.3  Platelet Count (CBC) 208  MCV 95  MCH 31.6  MCHC 33.2  RDW 13.7  Neutrophil % 82.9  Lymphocyte % 9.0  Monocyte % 7.0  Eosinophil % 0.7  Basophil % 0.4  Neutrophil #  11.3  Lymphocyte # 1.2  Monocyte # 1.0  Eosinophil # 0.1  Basophil # 0.1 (Result(s) reported on 18 Jun 2013 at 12:36AM.)  22-Jan-15 04:18   WBC (CBC) 10.1  RBC (CBC) 4.43  Hemoglobin (CBC) 14.1  Hematocrit (CBC) 42.1  Platelet Count (CBC) 207  MCV 95  MCH 31.9  MCHC 33.6  RDW 14.1  Neutrophil % 77.3  Lymphocyte % 11.9  Monocyte % 9.2  Eosinophil % 1.0  Basophil % 0.6  Neutrophil #  7.Nicholas  Lymphocyte # 1.2  Monocyte # 0.9  Eosinophil # 0.1  Basophil # 0.1 (Result(s) reported on 19 Jun 2013 at 04:57AM.)    Radiology Results: MRI:    22-Jan-15 14:14, MRI Brain Without Contrast  MRI Brain Without Contrast   REASON FOR EXAM:    encephalopathy. CVA?  COMMENTS:       PROCEDURE: MR  - MR BRAIN WO CONTRAST  - Jun 19 2013  2:14PM     CLINICAL DATA:  Encephalopathy. Possible stroke. Prior stroke and  recent head injury.    EXAM:  MRI HEAD WITHOUT CONTRAST    TECHNIQUE:  Multiplanar, multiecho pulse sequences of the brain and surrounding  structures were obtained without intravenous contrast.  COMPARISON:  Head CT 06/17/2013 and brain MRI 06/29/2011    FINDINGS:  There is no evidence of acute infarct. Thereis no intracranial  hemorrhage. Remote lacunar infarcts are again noted within the  posterior limb of the left internal capsule and left corona radiata.  Periventricular white matter T2 hyperintensities are nonspecific but  compatible with mild to moderate chronic small vessel ischemic  disease, unchanged. There is moderate cerebral atrophy, stable to  slightly progressed from the prior MRI. Major intracranial vascular  flow voids are unremarkable. Prior bilateral cataract surgery is  noted. Visualized paranasal sinuses and mastoid air cells are clear.     IMPRESSION:  No evidence of acute intracranial abnormality. Moderate chronic  small vessel ischemic disease and cerebral atrophy.      Electronically Signed    By: Logan Bores    On: 06/19/2013 16:23  Verified By: Ferol Luz, M.D.,  CT:    21-Jan-15 00:07, CT Head Without Contrast  CT Head Without Contrast   REASON FOR EXAM:    pain following trauma  COMMENTS:       PROCEDURE: CT  - CT HEAD WITHOUT CONTRAST  - Jun 18 2013 12:07AM     CLINICAL DATA:  Headache and neck pain, status post trauma.    EXAM:  CT HEAD WITHOUT CONTRAST    CT CERVICAL SPINE WITHOUT CONTRAST    TECHNIQUE:  Multidetector CT imaging of the head and cervical spine was  performed following the standard protocol without  intravenous  contrast. Multiplanar CT image reconstructions of the cervical spine  were also generated.    COMPARISON:  CT HEAD W/O CM dated 06/12/2013; CT NECK WO/W CM dated  10/29/2012; MR HEAD W/O CM dated 06/29/2011    FINDINGS:  CT HEAD FINDINGS    There is no evidence of acute infarction, mass lesion, or intra- or  extra-axial hemorrhage on CT.    Prominence ofthe ventricles and sulci reflects mild cortical volume  loss. Scattered periventricular and subcortical white matter change  likely reflects small vessel ischemic microangiopathy. A small  chronic lacunar infarct is noted at the posterior limb of theleft  internal capsule.    The brainstem and fourth ventricle are within normal limits. The  cerebral hemispheres demonstrate grossly normal gray-white  differentiation. No mass effect or midline shift is seen.    There is no evidence of fracture; visualized osseous structures are  unremarkable in appearance. The orbits are within normal limits. The  paranasal sinuses and mastoid air cells are well-aerated. Mild soft  tissue swelling is noted overlying the right frontal calvarium.    CT CERVICAL SPINE FINDINGS    There is no evidence of acute fracture or subluxation. Vertebral  bodies demonstrate normal height. There is multilevel disc space  narrowing along the lower cervical spine, with grade 1  anterolisthesis of C7 on T1, reflecting underlying facet disease.  Prevertebral soft tissues are within normal limits.    The thyroid gland is unremarkable in appearance. The visualized lung  apices are clear. Calcification is noted at the carotid bifurcations  bilaterally. Asymmetric prominenceof the fat at the right side of  the neck, with mildly prominent lymph nodes and underlying  inflammation, is grossly unchanged from the prior CT of the neck  from 10/29/2012.     IMPRESSION:  1. No evidence of traumatic intracranial injury or fracture.  2. No evidence of fracture or  subluxation along the cervical spine.  3. Mild soft tissue swelling noted overlying the right frontal  calvarium.  4. Mild cortical volume loss and scattered small vessel ischemic  microangiopathy; small chronic lacunar infarct at the posterior limb  of the left internal capsule.  5. Degenerative change noted along the lower cervical spine.  6. Calcification noted at the carotid bifurcations bilaterally.  7. Grossly stable appearance to asymmetric prominence of the fat at  the right side of the neck, containing chronic soft tissue  inflammation and mildly prominent lymph nodes. The presence of lymph  nodes and musculature within this region suggests a chronic soft  tissue inflammatory process rather than a mass.      Electronically Signed    By: Garald Balding M.D.    On: 06/18/2013 00:15         Verified By: JEFFREY . Radene Knee, M.D.,   Impression/Recommendations: Recommendations:   Nicholas year old man with  Alzheimers Fowler presents from his nursing home due to a seizure.  with Dilantin low at Nicholas.5.  Rec increase daily dose to dilantin extended release 400 mg qhs.  Unclear etiology for seizures starting new over the past few weeks.  Rec routine EEG.  Brain MRI personally viewed shows moderate diffuse atrophy but otherwise is unremarkable, no strokes or other lesions.  He is currently agitated most likely due to post ictal state.  Likely that this state will gradually improve over the next few days.  Given low Dilantin, I do not think that high dilantin may be causing or contributing to his excessive confusion, lethargy and agitation, much more likely to be from post ictal state with minimal brain reserve from underlying dementia.  Continue on depakote at same dose.  Patient does not have fever.  UA negative for UTI.  EKG unremarkable.   have reviewed the results of the most recent imaging studies, tests and labs as outlined above and answered all related questions. have personally viewed the  patient's Brain MRI and it is nonfocal. and coordinated plan of care with hospitalist.     Electronic Signatures: Anabel Bene (MD)  (Signed 22-Jan-15 23:31)  Authored: REFERRING PHYSICIAN, Primary Care Physician, Consult, History of Present Illness, Review of Systems, PAST MEDICAL/SURGICAL HISTORY, HOME MEDICATIONS, Current Medications, ALLERGIES, NURSING VITAL SIGNS, Physical Exam-, LAB RESULTS, RADIOLOGY RESULTS, Recommendations   Last Updated: 22-Jan-15 23:31 by Anabel Bene (MD)

## 2014-09-19 NOTE — Consult Note (Signed)
PATIENT NAME:  Nicholas Fowler, Nicholas Fowler MR#:  [id number] DATE OF BIRTH:  01/20/1936  DATE OF CONSULTATION:  06/21/2013  REFERRING PHYSICIAN:  Dr. Margarita Grizzle in the Emergency Room. CONSULTING PHYSICIAN:  Weston Settle, MD  REASON FOR CONSULTATION: Seizures.   HISTORY OF PRESENT ILLNESS: The patient is a 79 year old white male who was admitted from the nursing home to the ER with 2 witnessed generalized tonic-clonic seizures resulting in head and ear trauma. He has had about 3 other generalized tonic-clonic seizures over the last 3 weeks as well, requiring multiple outpatient ER visits as well as inpatient admission. This is a new onset seizure disorder from 3 weeks ago. He has a history of parkinsonism and dementia. The family is not clear whether he has Lewy body dementia or not, and they have never heard of that term. MRI of the brain was performed on June 19, 2013, which I have reviewed personally. It indicates global atrophy. There is evidence of an old left basal ganglia infarct. There is evidence of mild periventricular small vessel ischemic disease. There is also evidence of an old right anterior parietal and an old left parietal cortical infarct. No hemorrhages were present. On today's labs, his complete metabolic panel was normal except for a glucose of 156. Dilantin level is low at 9.1, and he is on 400 mg per day of that. Valproic acid is low at 13, but he has been on that for mood stabilization for 4 years. Urine drug screen is positive for benzodiazepines and opiates, both of which he was taking on a regular basis. CBC is normal. Urinalysis is normal except for 6 white blood cells, but negative nitrites or leukocyte esterase.   PAST MEDICAL HISTORY: 1.  Parkinsonism.  2.  Dementia.  3.  Strokes.  4.  Depression.  5.  Hypertension.  6.  Anxiety.   PAST SURGICAL HISTORY: Negative.   CURRENT MEDICATIONS AT HOME:  1.  Zoloft 100 mg daily.  2.  Requip 1 mg t.i.d.  3.  Prilosec 40 mg q.  daily.  4.  Aricept 10 mg daily.  5.  Valproic acid 250 mg b.i.d.  6.  Dilantin 200 mg b.i.d.  7.  Plavix 75 mg daily.  8.  Sinemet 50/200 b.i.d.  9.  Aspirin 81 mg daily.  10.  Norvasc 2.5 mg daily.  11.  Xanax 0.5 mg b.i.d.  12.  Vicodin p.r.n.  13.  The patient did receive 500 mg of fosphenytoin before I arrived to see him in the Emergency Room.   ALLERGIES: No known drug allergies.   SOCIAL HISTORY: No history of smoking, alcohol or illicit drugs.   FAMILY HISTORY: Noncontributory.   REVIEW OF SYSTEMS: Unobtainable because of the patient's dementia.   PHYSICAL EXAMINATION: VITAL SIGNS: His blood pressure is 113/80, pulse of 93, temperature 98.9. Pulse oximetry is 98% on room air.  HEART: Regular rate and rhythm, S1, S2. No murmurs.  LUNGS: Clear to auscultation.  NECK: There are no carotid bruits.  NEUROLOGIC: He is awake and alert. He is fluent. He is disoriented to the year, or the president, or the month, or the date or the day of the week, but he does know that he is in the hospital and he knows that he is in [place]. His repetition is somewhat impaired. His naming is intact. Extraocular movements are intact. Pupils are equal. Face is symmetrical. Tongue is midline. Palate raises symmetrically. Strength is 5 out of 5 bilaterally in upper and lower extremities in  all muscle groups. Reflexes are +2 and symmetrical. Sensation is intact to all modalities. Coordination is fully intact. There is no Babinski sign. There is no Hoffmann sign. There are no skin rashes. There is a contusion in his forehead as well as in the left auricular area from prior seizures. Gait testing was not performed due to safety reasons.   IMPRESSION AND PLAN: This is a 79 year old who has developed new-onset partial seizures with secondary generalization due to his underlying dementia process. I suspect he probably has Lewy body dementia causing seizures. However, if he has purely Parkinson's disease and then  he has Alzheimer's disease on top of that, that is also a factor for seizure initiation. Another factor could be the cortical stroke that he has had in the past, which could be a nidus for seizure generation. Dilantin has many significant adverse effects in the long run and therefore, I do not recommend continuing that as an outpatient. It may contribute to cerebellar atrophy as well as cause peripheral neuropathy, both of which can impair his gait, and that could be disastrous in someone with concomitant parkinsonism. He has been on Depakote for mood stabilization, and I will keep that the same for now since we do not want to make too many changes at one time. However, that drug is hepatotoxic and can cause hyperammonemia in the elderly, which can lead to some encephalopathy and changes like that which may not be desirable. Nevertheless, we will continue it in the short term. The patient will receive 200 mg of IV Vimpat load here. He will then start Vimpat 100 mg b.i.d. starting tomorrow. After one week, this should be titrated up to 200 mg b.i.d., which is the maximum maintenance dose. He will continue that in combination with Depakote, and depending on how he does clinically, further assessment can be made to adding another adjunctive therapy if necessary.   ____________________________ Weston SettleShervin Vastie Douty, MD se:jcm D: 06/21/2013 15:27:09 ET T: 06/21/2013 17:39:28 ET JOB#: [id number]  cc: Weston SettleShervin Ranesha Val, MD, <Dictator> Weston SettleSHERVIN Temiloluwa Laredo MD ELECTRONICALLY SIGNED 06/22/2013 10:51

## 2014-09-19 NOTE — H&P (Signed)
PATIENT NAME:  Nicholas Fowler, Nicholas Fowler MR#:  [id number] DATE OF BIRTH:  Aug 07, 1935  DATE OF ADMISSION:  06/17/2013  REFERRING PHYSICIAN:  Dr. Marjean Donna.  PRIMARY CARE PHYSICIAN:  Dr.  Lovie Macadamia.   PRIMARY NEUROLOGIST:  Dr. Jennings Books.   CHIEF COMPLAINT:  Seizures and altered mental status.   HISTORY OF PRESENT ILLNESS:  This is a 79 year old male with significant past medical history of Parkinson, CVA, dementia, vascular versus Alzheimer's, bedridden, ambulates only with a wheelchair, lives at a nursing home, the patient was sent from the nursing home today due to episode of seizure and altered mental status, the patient was last week here in [place] [facility] ED as he presented with seizures, the patient was sent home on by mouth Dilantin, the patient is known to have history of multiple falls as well, was sent from nursing home today for what he states fall and was noticed to have episodes of seizures, apparently on presentation to ED the patient was noticed to be extremely agitated and combative where he required multiple Ativan injections and Haldol and Benadryl to calm him down, the patient's CT brain did not show any acute findings, his Dilantin level was on the lower side 8.5, the patient's family at bedside, son and wife, and they gave most of the history, the patient had an episode of agitation in the past at the nursing home, but not that severe, where he was been on Depakote for that, the patient has been followed by Dr. Manuella Ghazi for his Parkinson and dementia and history of CVA in the past, currently, the patient is lethargic due to sedation with the medications he took, and cannot give any history and cooperate with the physical exam.   PAST MEDICAL HISTORY: 1.  History of CVA.  2.  Hypertension.  3.  COPD.  4.  Anxiety and depression.  5.  Parkinson disease with tremors. 6.  History of dementia.  7.  Hyperlipidemia.  8.  GERD.   PAST SURGICAL HISTORY: 1.  Back surgery.  2.  Cataract  surgery.  ALLERGIES:  PENICILLIN.   SOCIAL HISTORY:  The patient lives at a skilled nursing facility.  He quit smoking in 2012.  No alcohol.  No recreational drug use.   FAMILY HISTORY:  Significant for brain tumor in his mother and father dying in his 21s from CVA.   HOME MEDICATIONS: 1.  Aspirin 81 mg daily.  2.  Norco 2 times a day as needed.  3.  Ultracet as needed.  4.  MiraLAX as needed.  5.  Mylanta as needed.  6.  Dilantin 300 mg oral at bedtime.  7.  Depakote sprinkles 250 mg oral daily.  8.  Zoloft 100 mg oral daily.  9.  Carbidopa, levodopa 50/200 extended release 2 times a day.  10.  Requip 1 mg oral 3 times a day.  11.  Plavix 75 mg oral daily.  12.  Xanax 0.5 mg oral 2 times a day as needed.  13.  Norvasc 2.5 mg oral daily.  14.  Aricept 10 mg oral at bedtime.  15.  Cleocin 300 mg oral every eight hours.  16.  Melatonin 3 mg oral at bedtime.  17.  Prilosec 40 mg oral daily.   REVIEW OF SYSTEMS:  Unable to obtain as currently the patient is lethargic and at baseline he has advanced dementia, cannot give any history.   PHYSICAL EXAMINATION: VITAL SIGNS:  Pulse 98, respiratory rate 18, blood pressure 159/84.  Saturating 98% on  room air.  GENERAL:  Well-nourished elderly male sleeps comfortable in bed, in no apparent distress.   HEENT:  Head had multiple bruises, one in the forehead and one ecchymosis around the left ear area.  Pupils equal, reactive to light.  Pink conjunctivae.  Anicteric sclerae.  Moist oral mucosa.  NECK:  Supple.  No thyromegaly.  No JVD.  No carotid bruits.  CHEST:  Good air entry bilaterally.  No wheezing, rales, rhonchi.  CARDIOVASCULAR:  S1, S2 heard.  No rubs, murmur, gallops.  ABDOMEN:  Soft, nontender, nondistended.  Bowel sounds present.  EXTREMITIES:  No edema.  No clubbing.  No cyanosis.  Pedal pulses felt bilaterally.  PSYCHIATRIC:  The patient is lethargic, unable to evaluate appropriately.  NEUROLOGIC:  The patient is lethargic,  unable to evaluate.  SKIN:  Warm and dry.  Has multiple skin bruises in upper and lower extremities and in the chest from previous falls.  LYMPHATICS:  No cervical lymphadenopathy could be appreciated.  MUSCULOSKELETAL:  No joint effusion or erythema.   PERTINENT LABORATORY DATA:  Glucose 88, BUN 13, creatinine 0.82, sodium 139, potassium 4.5, chloride 104.  ALT 10, AST 36, alk phos 67.  Dilantin 8.5.  White blood cells 13.7, hemoglobin 14.7, hematocrit 44.3, platelets 208.   IMAGING STUDIES:  CT head without contrast and CT spine without contrast showing no evidence of traumatic intracranial injury or fracture or subluxation along the cervical spine and mild soft tissue swelling overlying the right frontal calvarium and mild cortical volume loss.   ASSESSMENT AND PLAN: 1.  Seizures, the patient appears to be having recurrent seizures, was diagnosed last week, he has an appointment with Dr. Manuella Ghazi next Friday.  He did not see him yet, so the patient will be admitted.  We will obtain an EEG, we will consult neurology service on call, as the patient currently lethargic, cannot take any by mouth meds we will change his Dilantin to IV.  As well, we will load him 500 mg x 1 as his Dilantin level was on the lower side.  We will keep him on seizure precautions.  2.  Altered mental status, as per family agitation is usually not that severe, this is most likely related to his advanced dementia, we will keep him on neuro check.  CT did not show any acute findings.  3.  History of cerebrovascular accident.  The patient will be resumed on aspirin and Plavix when he is more awake and it is safe to give him by mouth intake.  4.  Hypertension.  We will resume back on Norvasc when able to tolerate by mouth intake.  5.  History of Parkinson.  Can resume him back on carbidopa, levodopa when he is able to take by mouth intake.  6.  Dementia.  We will continue Aricept when he is able to take by mouth intake.  7.  Deep  vein thrombosis prophylaxis.  SubQ Lovenox.  8.  CODE STATUS discussed with the wife, reports she is his healthcare power of attorney.  He does not have a LIVING WILL and he is a FULL CODE.   Total time spent on admission and patient care 50 minutes.    ____________________________ Albertine Patricia, MD dse:ea D: 06/18/2013 03:14:53 ET T: 06/18/2013 04:10:15 ET JOB#: [id number]  cc: Albertine Patricia, MD, <Dictator> DAWOOD Graciela Husbands MD ELECTRONICALLY SIGNED 06/25/2013 23:58

## 2014-09-20 NOTE — H&P (Signed)
PATIENT NAME:  Nicholas Fowler, Nicholas Fowler MR#:  [id number] DATE OF BIRTH:  [birth year]  DATE OF ADMISSION:  06/28/2011  ADMITTING PHYSICIAN: Dr. Enid Baas  PRIMARY CARE PHYSICIAN: Dr. Burnell Blanks PRIMARY NEUROLOGIST: Dr. Cristopher Peru   CHIEF COMPLAINT: Right-sided weakness.   HISTORY OF PRESENT ILLNESS: Mr. Weightman is a 79 year old Caucasian male with past medical history significant for left basal ganglia infarct in 02/2011 resulting in some slurred speech, residual mild right arm and also leg weakness with repeat admissions to the hospital in November and also once in December 2012 with recurring of symptoms of worsening of the right side which were transient. His repeat MRI on those readmissions showed no acute stroke. He has been seen by Dr. Cristopher Peru while in the hospital for the last three hospitalizations and according to Dr. Sherryll Burger the repeat episodes could have been just like transient ischemic attack because of worsening of ischemia to his prior area of infarct may be secondary to hypotension or some other reason. But he did not have any repeat infarct on repeat MRI. He did okay at rehabilitation, went home, has been doing well at home up until this morning. Patient is not a great historian. His wife at bedside provides most of the history. According to him and his wife he was awake this morning in bed and tried to get out of bed and said he did not have the strength to get up so he fell on the floor and hit his head on the right side. Since then according to wife he has been having trouble standing, using strength of his right leg so she could not dress him and this is not normal of him since he recovered from his stroke. He usually walks by himself or uses a walker for support at times but today she felt that his right arm and leg have been much weaker. She has not noticed a whole lot of difference in his speech though. He has also been having some trouble swallowing but that has been gradually  going down. He also has Parkinson's disease and he has been on Sinemet. They called Dr. Margaretmary Eddy office after the weakness was noticed this morning per Dr. Sherryll Burger as I have discussed with him in the Emergency Room, they were advised that this recurrent weakness could probably be exacerbation of his underlying stroke symptoms and not a new stroke but since he hit his head he wanted to make sure there is no bleed or anything so advised him to come to the ER. His strength has improved a little bit on my examination on his right side but he still does not feel back to baseline so he is being admitted under observation for right-sided symptoms, probably recurrence of his prior stroke symptoms worsening without any new stroke. He will get physical therapy evaluation, repeat MRI brain and then possibly if it is negative he can go home.   PAST MEDICAL HISTORY:  1. History of cerebrovascular accident in 02/2011 with left internal capsule/basal ganglion infarct.   2. Repeat admissions in November 2012 and December 2012 for exacerbation of his previous stroke symptoms with right-sided weakness.  3. Hypertension.  4. Chronic obstructive pulmonary disease.  5. Anxiety and depression.  6. Parkinson's disease with tremor.   PAST SURGICAL HISTORY:  1. Back surgery.  2. Cataract surgery.   ALLERGIES: Penicillin.   MEDICATIONS AT HOME:  1. Advair 250/50, 1 puff b.i.d.  2. Xanax 0.25 mg b.i.d. as needed for anxiety and one  more dose 0.5 mg at bedtime as needed.  3. Norvasc 2.5 mg p.o. daily.  4. Zoloft 25 mg p.o. daily.  5. Sinemet 25/100, 2 capsules p.o. t.i.d.  6. Aspirin 81 mg p.o. daily.  7. Plavix 75 mg p.o. daily.  8. Zocor 20 mg p.o. at bedtime.  9. Prilosec 40 mg p.o. daily.   SOCIAL HISTORY: Lives at home with wife. Used to smoke about a pack per day and quit since 03/2011 after his stroke. No history of any alcohol or recreational drug use.   FAMILY HISTORY: Mom died from brain tumor in her 64s.  Father died in 79s with stroke. Brother and sister with diabetes and brother also with heart disease.    REVIEW OF SYSTEMS: CONSTITUTIONAL: No fever, fatigue, or weakness. EYES: Status post cataract surgery. Uses glasses. No blurry vision or double vision. ENT: No tinnitus, ear pain, hearing loss, epistaxis or discharge. Positive for some dysphagia probably from Parkinson's disease. RESPIRATORY: No cough, wheeze, hemoptysis, or chronic obstructive pulmonary disease. CARDIOVASCULAR: No chest pain, orthopnea, edema, arrhythmia, or palpitations. GASTROINTESTINAL: No nausea, vomiting, diarrhea, abdominal pain, or hematemesis. GENITOURINARY: No dysuria, hematuria, renal calculus, or frequency. ENDOCRINE: No polyuria, nocturia, thyroid problems, heat or cold intolerance. HEMATOLOGY: No anemia, easy bruising or bleeding. SKIN: No acne, rash, or lesions. MUSCULOSKELETAL: No neck pain. Positive for low back pain. NEUROLOGICAL: History of cerebrovascular accident. No seizures. Currently with right-sided weakness. PSYCH: No anxiety, insomnia, or depression.   PHYSICAL EXAMINATION:  VITAL SIGNS: Temperature 97.9 degrees Fahrenheit, pulse 80, respirations 18, current blood pressure 105/60, pulse oximetry 93% on room air.   GENERAL: Well built, well nourished male lying in bed, not in any acute distress.   HEENT: Normocephalic, atraumatic. There is a small laceration on the right side of the forehead from the fall this morning. Pupils equal, round, reacting to light. Anicteric sclerae. Extraocular movements intact. Oropharynx clear without erythema, mass or exudates.   NECK: Supple. No thyromegaly, JVD, or carotid bruits. No lymphadenopathy.   LUNGS: Clear to auscultation bilaterally. No wheeze or crackles. No use of accessory muscles for breathing.   CARDIOVASCULAR: S1, S2 regular rate and rhythm. No murmurs, rubs, or gallops.   ABDOMEN: Soft, nontender, nondistended. No hepatosplenomegaly. Normal bowel sounds.    EXTREMITIES: No pedal edema. No clubbing or cyanosis. 2+ dorsalis pedis pulses palpable bilaterally. There is intentional tremor on examination bilateral upper extremities.   LYMPHATICS: No cervical lymphadenopathy.   SKIN: No acne, rash, or lesions.   NEUROLOGICAL: Cranial nerves II through XII are intact. Speech is clear though it is slow probably from Parkinson's disease. Strength is 4/5 in right arm, especially decreased grip and also there is intentional tremor so that makes it difficult for complete exam but sensory is intact. Reflexes are 2+ both biceps and also knee and ankle jerks bilateral lower extremities. Left leg is 5/5 and the right leg is 4+/5. Did not examine his gait.   PSYCHOLOGICAL: Patient is alert, oriented to place, person and time.   LABORATORY, DIAGNOSTIC AND RADIOLOGICAL DATA:  WBC 9.3, hemoglobin 14.3, hematocrit 42.3, platelet count 178.   Sodium 143, potassium 4.1, chloride 105, bicarbonate 28, BUN 12, creatinine 0.73, glucose 105, calcium 9.0.   ALT 10, AST 19, alkaline phosphatase 63, total bilirubin 0.8, albumin 3.8. CT of the head showing stable CT brain with evidence of atrophy and chronic small vessel ischemic disease. Old left basal ganglion infarct present. No acute intracranial abnormality present. EKG showing normal sinus rhythm, heart rate  of 72. No acute ST-T wave abnormalities.   ASSESSMENT AND PLAN: This is a 79 year old male with history of left internal capsule/basal ganglion infarct in 02/2011 with mild residual right-sided weakness. Repeat admissions for the same in November and December 2012 for exacerbation of his underlying prior stroke symptoms without any new strokes at the time, comes back with similar symptoms, right arm and leg weakness resulting in fall this morning.  1. Transient ischemic attack symptoms in a patient with prior left basal ganglion infarct two months ago. This probably is again exacerbation of his previous stroke symptoms  with right arm and leg weakness triggered by unknown factors, maybe borderline low blood pressure. CT of the head is negative for any hematoma or hemorrhage from the fall. So will admit under observation to telemetry. Discussed the case with Dr. Cristopher PeruHemang Shah who knows the patient very well. Will get repeat MRI of the brain but no need for repeating the Doppler's and echocardiogram as those were done during his past admissions in the last three months. If the MRI does not show any new infarct this could be just be exacerbation of his underlying old infarct and this could happen again in the near future too. Explained this to the wife. Will continue his aspirin and Plavix. He is not a candidate for Coumadin. Discussed again this with Dr. Sherryll BurgerShah. Will discontinue his Norvasc so that his blood pressure can be maintained. Will get physical therapy evaluation and if the MRI of his brain is negative he can be discharged tomorrow. Patient also on Zocor, will continue that.  2. Parkinson's disease. Continue Sinemet. He is supposed to be taking 2 pills t.i.d. with meals. Will increase it and medications crushed in applesauce because of dysphagia associated with that. He can follow up with Dr. Cristopher PeruHemang Shah in 1 to 2 weeks after discharge for the same.  3. Chronic obstructive pulmonary disease, appears stable. Continue Advair. No need for any oral steroids at this time.  4. Depression. Continue Zoloft.  5. Hypertension. Blood pressure is on low normal side. He was only on very low dose Norvasc; will discontinue it as hypotension might trigger ischemia and recurrence of his stroke symptoms.   6. GI and deep vein thrombosis prophylaxis. On Protonix and Lovenox.  7. CODE STATUS: FULL CODE.   TIME SPENT ON ADMISSION: 50 minutes.   ____________________________ Enid Baasadhika Feather Berrie, MD rk:cms D: 06/28/2011 16:48:10 ET T: 06/28/2011 17:35:45 ET JOB#: [id number]  cc: Enid Baasadhika Nakiea Metzner, MD, <Dictator> Maura L. Hamrick, MD Hemang  K. Sherryll BurgerShah, MD  Enid BaasADHIKA Brennah Quraishi MD ELECTRONICALLY SIGNED 07/04/2011 13:39

## 2014-09-20 NOTE — Consult Note (Signed)
PATIENT NAME:  Nicholas Fowler, DUBIE MR#:  [id number] DATE OF BIRTH:  July 29, 1935  DATE OF CONSULTATION:  05/09/2011  REFERRING PHYSICIAN:  Dr. Hilda Lias  CONSULTING PHYSICIAN:  Hemang K. Sherryll Burger, MD  REASON FOR CONSULTATION: Speech difficulty and right-sided weakness in patient with recurrent recent stroke and parkinsonism.   HISTORY OF PRESENT ILLNESS: Nicholas Fowler is a 79 year old Caucasian gentleman who had his most recent stroke on 03/17/2011 caused to have weakness of his right hand and some speech difficulty and right facial weakness. He had another stroke in 04/14/2011. old deficit which got worse after had interval improvement.   Around 05/08/2011 family felt he was having worsening of his speech again, was very difficult to understand and he was having difficulty moving his right hand.   Sons feels like patient does better when is at the rehab place but once he comes home he deteriorates.    This time there was no episode of hypotension or any other fever or other signs of infection.   After patient got hospitalized he received MRI of the brain which did not show any new infarct, just showed signs of previous infarct.   PAST MEDICAL HISTORY:  1. Hypertension. 2. Diabetes.  3. Remote smoking which he has stopped.  4. Anxiety.  5. Chronic obstructive pulmonary disease. 6. Low back pain. 7. Hyperlipidemia. 8. Tremors.   PAST SURGICAL HISTORY: Lower back surgery.   MEDICATIONS: I reviewed his current medication list.   SOCIAL HISTORY: He is married and has three children. He used to smoke but now he has stopped.   FAMILY HISTORY: Significant for coronary artery disease, brain tumor, diabetes, arthritis. Mom died with brain tumor and father died with stroke.   REVIEW OF SYSTEMS: His review of systems was asked and was positive for significant speech difficulty, some swallowing difficulty, difficulty maneuvering right hand but otherwise it was unremarkable except history of present  illness. He also had some tremors.   PHYSICAL EXAMINATION:  VITAL SIGNS: Temperature 98.1, pulse 83, respiratory rate 20, blood pressure 111/64, pulse oximetry 98% on 2 liters O2.   GENERAL: He is an elderly looking Caucasian gentleman lying in bed, not in acute distress.   LUNGS: Clear to auscultation.   HEART: S1, S2 heart sounds. Carotid exam did not reveal any bruit.   NEUROLOGIC: He was alert. He was oriented to the hospital, today's date, day, month, year, current president's name, etc. He did help son provide history but he does have significant dysarthria.   On his cranial nerves, his pupils are equal, round, and reactive. Extraocular movements are intact. His face was symmetric. Tongue was midline. Facial sensations were intact. After protruding his tongue 2 to 3 times there might be slight right side deviation but he can move his tongue very well. He does have symmetric palate.   On his motor examination, he does have some decreased dexterity in his right hand but he does have some baseline resting tremor as well as postural tremor.   His strength in lower extremities seemed to be 5/5.   His sensations were intact to light touch in his lower extremities. His deep tendon reflexes are symmetric.   He had difficulty standing up and walking.   ASSESSMENT AND PLAN:  1. Worsening dysarthria and some right-sided weakness which has resolved. He had MRI of the brain which did not show any new lesion but does have a lesion from his previous strokes and other white matter microvascular ischemic changes.   I  will not consider this as a new stroke but rather will consider it as exacerbation of symptoms from his previous stroke.   Will not change his antiplatelet regimen for now. He is not a candidate for anticoagulation with Coumadin.   I informed the son and patient about not trying to lower his blood pressure aggressively. Also need for continued rehab.    He should be evaluated by  speech therapy to make sure that he is safe to swallow, etc.   I had an extensive discussion with the patient and family about pathophysiology of recurrent stroke symptoms in a stressful situation when compensation goes down and recurrence of symptoms can happen without having any brain ischemia.   2. Tremors. He has a combination of parkinsonism and benign essential tremor-related tremors. For now will continue him on meds. 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   I briefly mentioned to them about camptocormia which is significant for flexion in a person when they try to stand up but if they are sitting or lying down they can keep their back straight.   I also discussed with them consideration should be given for placement.   I will see them in the hospital on a less frequent basis. Feel free to contact me with any further questions. ____________________________ Durene CalHemang K. Sherryll BurgerShah, MD hks:cms D: 05/09/2011 22:12:49 ET T: 05/10/2011 09:14:25 ET  JOB#: [id number] cc: Hemang K. Sherryll BurgerShah, MD, <Dictator> Maura L. Hamrick, MD Durene CalHEMANG [facility] MD ELECTRONICALLY SIGNED 06/02/2011 9:07

## 2014-09-20 NOTE — Discharge Summary (Signed)
PATIENT NAME:  Nicholas PalmaFOGLEMAN, Emile E MR#:  [id number] DATE OF BIRTH:  1936-05-19  DATE OF ADMISSION:  06/28/2011 DATE OF DISCHARGE:  06/30/2011  DISCHARGE DIAGNOSES:  1. Weakness on the right side because of old stroke with ambulatory difficulty causing falls.  2. Hypertension.  3. Parkinson's dementia.  4. History of previous strokes. The patient has history of left basal ganglion infarct in October with severe right-sided weakness.  5. Depression.   DISCHARGE MEDICATIONS:  1. Advair Diskus 250/50, one puff b.i.d.  2. Xanax 0.25 mg p.o. b.i.d.  3. Norvasc 2.5 mg p.o. daily.  4. Zoloft 25 mg p.o. daily.  5. Sinemet 25/100, one tablet p.o. 3 times daily. 6. Aspirin 81 mg p.o. daily.  7. Plavix 75 mg p.o. daily.  8. Zocor 20 mg p.o. daily.  9. Prilosec 40 mg p.o. daily.   DIET: Low sodium diet.   ACTIVITY: As per physical therapy, the patient will be going home with home physical therapy.   CONSULTATIONS: None.   HOSPITAL COURSE:  291. A 79 year old male patient with history of stroke on the left basal ganglia with history of right-sided weakness and some slurred speech who came in because of right-sided weakness and fall at home. Please see the history and physical for full details. The patient is admitted to observation status for possible transient ischemic attack versus cerebrovascular accident. Initial CT of the head did not show any acute changes. MRA of the brain showed chronic involutional changes without evidence of acute abnormality. The patient's other lab data stayed within normal limits especially the CBC and BMP and urine did not show any leukocyte esterase or infection. EKG showed normal sinus rhythm at 72 beats per minute. He is continued on his home medications. The patient neurologically stayed stable without any further neurological changes and Dr. Nemiah CommanderKalisetti has spoken to Dr. Sherryll BurgerShah, who did not recommend repeating the echocardiogram or carotid ultrasound due to recent work-up in  December. The patient had a fall at probably because of his weakness from previous stroke and has been seen by the physical therapist who recommended rehab and Herbert SetaHeather has contacted [facility] to find out how many rehab days he has and Herbert SetaHeather told me that the patient cannot go to rehab and his wife is planning to take him home with home physical therapy and we have arranged home physical therapy. The patient will continue home physical therapy.  2. Parkinson's dementia. The patient does have some confusion. He was agitated this morning. He received Xanax and I explained to the wife where he may be getting dementia which is worsening and he can continue the Parkinson medication and see Dr. Sherryll BurgerShah as an outpatient.  3. Depression. He is on Zoloft. We continued on that. The patient can have Xanax as needed.   TIME SPENT ON DISCHARGE PREPARATION: More than 30 minutes.   ____________________________ Katha HammingSnehalatha Rylea Selway, MD sk:ap D: 06/30/2011 13:49:23 ET T: 07/02/2011 13:42:01 ET JOB#: [id number]  cc: Katha HammingSnehalatha Zemirah Krasinski, MD, <Dictator> Katha HammingSNEHALATHA Jannet Calip MD ELECTRONICALLY SIGNED 07/17/2011 16:25

## 2015-10-28 DIAGNOSIS — N390 Urinary tract infection, site not specified: Secondary | ICD-10-CM

## 2015-10-28 DIAGNOSIS — K920 Hematemesis: Secondary | ICD-10-CM

## 2015-10-28 HISTORY — DX: Hematemesis: K92.0

## 2015-10-28 HISTORY — DX: Urinary tract infection, site not specified: N39.0

## 2015-11-23 ENCOUNTER — Inpatient Hospital Stay (HOSPITAL_COMMUNITY): Payer: Medicare Other

## 2015-11-23 ENCOUNTER — Encounter (HOSPITAL_COMMUNITY): Payer: Self-pay | Admitting: General Practice

## 2015-11-23 ENCOUNTER — Encounter: Payer: Self-pay | Admitting: Emergency Medicine

## 2015-11-23 ENCOUNTER — Emergency Department
Admission: EM | Admit: 2015-11-23 | Discharge: 2015-11-23 | Disposition: A | Discharge status: Other Acute Inpatient Hospital | Payer: Medicare Other | Attending: Emergency Medicine | Admitting: Emergency Medicine

## 2015-11-23 ENCOUNTER — Inpatient Hospital Stay (HOSPITAL_COMMUNITY)
Admission: AD | Admit: 2015-11-23 | Discharge: 2015-11-25 | DRG: 378 | Disposition: A | Discharge status: Skilled Nursing Facility | Payer: Medicare Other | Source: Other Acute Inpatient Hospital | Attending: Internal Medicine | Admitting: Internal Medicine

## 2015-11-23 DIAGNOSIS — R7989 Other specified abnormal findings of blood chemistry: Secondary | ICD-10-CM | POA: Diagnosis not present

## 2015-11-23 DIAGNOSIS — Z791 Long term (current) use of non-steroidal anti-inflammatories (NSAID): Secondary | ICD-10-CM | POA: Diagnosis not present

## 2015-11-23 DIAGNOSIS — N3001 Acute cystitis with hematuria: Secondary | ICD-10-CM | POA: Diagnosis not present

## 2015-11-23 DIAGNOSIS — K920 Hematemesis: Secondary | ICD-10-CM

## 2015-11-23 DIAGNOSIS — Z8673 Personal history of transient ischemic attack (TIA), and cerebral infarction without residual deficits: Secondary | ICD-10-CM | POA: Insufficient documentation

## 2015-11-23 DIAGNOSIS — I2510 Atherosclerotic heart disease of native coronary artery without angina pectoris: Secondary | ICD-10-CM | POA: Diagnosis present

## 2015-11-23 DIAGNOSIS — Z880 Allergy status to penicillin: Secondary | ICD-10-CM | POA: Diagnosis not present

## 2015-11-23 DIAGNOSIS — G20 Parkinson's disease: Secondary | ICD-10-CM | POA: Insufficient documentation

## 2015-11-23 DIAGNOSIS — F411 Generalized anxiety disorder: Secondary | ICD-10-CM | POA: Diagnosis present

## 2015-11-23 DIAGNOSIS — T17908A Unspecified foreign body in respiratory tract, part unspecified causing other injury, initial encounter: Secondary | ICD-10-CM

## 2015-11-23 DIAGNOSIS — R748 Abnormal levels of other serum enzymes: Secondary | ICD-10-CM | POA: Diagnosis present

## 2015-11-23 DIAGNOSIS — K219 Gastro-esophageal reflux disease without esophagitis: Secondary | ICD-10-CM | POA: Diagnosis present

## 2015-11-23 DIAGNOSIS — R778 Other specified abnormalities of plasma proteins: Secondary | ICD-10-CM

## 2015-11-23 DIAGNOSIS — R319 Hematuria, unspecified: Secondary | ICD-10-CM | POA: Diagnosis present

## 2015-11-23 DIAGNOSIS — J441 Chronic obstructive pulmonary disease with (acute) exacerbation: Secondary | ICD-10-CM | POA: Diagnosis present

## 2015-11-23 DIAGNOSIS — E039 Hypothyroidism, unspecified: Secondary | ICD-10-CM | POA: Insufficient documentation

## 2015-11-23 DIAGNOSIS — M797 Fibromyalgia: Secondary | ICD-10-CM | POA: Diagnosis present

## 2015-11-23 DIAGNOSIS — N3000 Acute cystitis without hematuria: Secondary | ICD-10-CM | POA: Diagnosis not present

## 2015-11-23 DIAGNOSIS — Z79899 Other long term (current) drug therapy: Secondary | ICD-10-CM

## 2015-11-23 DIAGNOSIS — F0390 Unspecified dementia without behavioral disturbance: Secondary | ICD-10-CM | POA: Diagnosis present

## 2015-11-23 DIAGNOSIS — Z66 Do not resuscitate: Secondary | ICD-10-CM | POA: Diagnosis present

## 2015-11-23 DIAGNOSIS — R1310 Dysphagia, unspecified: Secondary | ICD-10-CM | POA: Diagnosis present

## 2015-11-23 DIAGNOSIS — Z87891 Personal history of nicotine dependence: Secondary | ICD-10-CM

## 2015-11-23 DIAGNOSIS — J449 Chronic obstructive pulmonary disease, unspecified: Secondary | ICD-10-CM | POA: Insufficient documentation

## 2015-11-23 DIAGNOSIS — E785 Hyperlipidemia, unspecified: Secondary | ICD-10-CM | POA: Diagnosis present

## 2015-11-23 DIAGNOSIS — R54 Age-related physical debility: Secondary | ICD-10-CM | POA: Diagnosis present

## 2015-11-23 DIAGNOSIS — N390 Urinary tract infection, site not specified: Secondary | ICD-10-CM | POA: Diagnosis present

## 2015-11-23 HISTORY — DX: Parkinson's disease: G20

## 2015-11-23 HISTORY — DX: Atherosclerotic heart disease of native coronary artery without angina pectoris: I25.10

## 2015-11-23 HISTORY — DX: Enterocolitis due to Clostridium difficile, not specified as recurrent: A04.72

## 2015-11-23 HISTORY — DX: Hematemesis: K92.0

## 2015-11-23 HISTORY — DX: Dysphagia, unspecified: R13.10

## 2015-11-23 HISTORY — DX: Unspecified dementia, unspecified severity, without behavioral disturbance, psychotic disturbance, mood disturbance, and anxiety: F03.90

## 2015-11-23 HISTORY — DX: Hyperlipidemia, unspecified: E78.5

## 2015-11-23 HISTORY — DX: Fibromyalgia: M79.7

## 2015-11-23 HISTORY — DX: Cerebral infarction, unspecified: I63.9

## 2015-11-23 HISTORY — DX: Hypothyroidism, unspecified: E03.9

## 2015-11-23 HISTORY — DX: Gastro-esophageal reflux disease without esophagitis: K21.9

## 2015-11-23 HISTORY — DX: Anxiety disorder, unspecified: F41.9

## 2015-11-23 HISTORY — DX: Urinary tract infection, site not specified: N39.0

## 2015-11-23 HISTORY — DX: Chronic obstructive pulmonary disease, unspecified: J44.9

## 2015-11-23 LAB — CBC WITH DIFFERENTIAL/PLATELET
BASOS ABS: 0.1 10*3/uL (ref 0–0.1)
EOS ABS: 0.1 10*3/uL (ref 0–0.7)
HCT: 38.6 % — ABNORMAL LOW (ref 40.0–52.0)
Hemoglobin: 13 g/dL (ref 13.0–18.0)
Lymphocytes Relative: 12 %
Lymphs Abs: 1.9 10*3/uL (ref 1.0–3.6)
MCH: 30.1 pg (ref 26.0–34.0)
MCHC: 33.8 g/dL (ref 32.0–36.0)
MCV: 89.1 fL (ref 80.0–100.0)
MONO ABS: 0.7 10*3/uL (ref 0.2–1.0)
NEUTROS ABS: 13.8 10*3/uL — AB (ref 1.4–6.5)
Neutrophils Relative %: 82 %
PLATELETS: 367 10*3/uL (ref 150–440)
RBC: 4.33 MIL/uL — ABNORMAL LOW (ref 4.40–5.90)
RDW: 15.6 % — AB (ref 11.5–14.5)
WBC: 16.6 10*3/uL — ABNORMAL HIGH (ref 3.8–10.6)

## 2015-11-23 LAB — URINALYSIS COMPLETE WITH MICROSCOPIC (ARMC ONLY)
BACTERIA UA: NONE SEEN
BILIRUBIN URINE: NEGATIVE
GLUCOSE, UA: NEGATIVE mg/dL
Hgb urine dipstick: NEGATIVE
Ketones, ur: NEGATIVE mg/dL
NITRITE: POSITIVE — AB
Protein, ur: 30 mg/dL — AB
SQUAMOUS EPITHELIAL / LPF: NONE SEEN
Specific Gravity, Urine: 1.018 (ref 1.005–1.030)
pH: 8 (ref 5.0–8.0)

## 2015-11-23 LAB — COMPREHENSIVE METABOLIC PANEL
ALBUMIN: 3.7 g/dL (ref 3.5–5.0)
ALT: 11 U/L — ABNORMAL LOW (ref 17–63)
ANION GAP: 6 (ref 5–15)
AST: 19 U/L (ref 15–41)
Alkaline Phosphatase: 63 U/L (ref 38–126)
BUN: 16 mg/dL (ref 6–20)
CHLORIDE: 109 mmol/L (ref 101–111)
CO2: 27 mmol/L (ref 22–32)
Calcium: 9.1 mg/dL (ref 8.9–10.3)
Creatinine, Ser: 0.8 mg/dL (ref 0.61–1.24)
GFR calc Af Amer: >60 mL/min (ref >60)
GFR calc non Af Amer: >60 mL/min (ref >60)
GLUCOSE: 115 mg/dL — AB (ref 65–99)
POTASSIUM: 4.2 mmol/L (ref 3.5–5.1)
Sodium: 142 mmol/L (ref 135–145)
TOTAL PROTEIN: 8 g/dL (ref 6.5–8.1)
Total Bilirubin: 0.2 mg/dL — ABNORMAL LOW (ref 0.3–1.2)

## 2015-11-23 LAB — TROPONIN I: TROPONIN I: 0.08 ng/mL — AB (ref <0.03)

## 2015-11-23 LAB — LACTIC ACID, PLASMA: LACTIC ACID, VENOUS: 1.2 mmol/L (ref 0.5–1.9)

## 2015-11-23 LAB — HEMOGLOBIN AND HEMATOCRIT, BLOOD
HEMATOCRIT: 35.1 % — AB (ref 39.0–52.0)
HEMOGLOBIN: 11 g/dL — AB (ref 13.0–17.0)

## 2015-11-23 MED ORDER — SODIUM CHLORIDE 0.9 % IV SOLN
200.0000 mg | Freq: Two times a day (BID) | INTRAVENOUS | Status: DC
  Administered 2015-11-24 (×2): 200 mg via INTRAVENOUS
  Filled 2015-11-23 (×8): qty 20

## 2015-11-23 MED ORDER — LACOSAMIDE 50 MG PO TABS
200.0000 mg | ORAL_TABLET | Freq: Two times a day (BID) | ORAL | Status: DC
  Filled 2015-11-23: qty 4

## 2015-11-23 MED ORDER — LORAZEPAM 1 MG PO TABS: 1.0000 mg | ORAL_TABLET | Freq: Four times a day (QID) | ORAL | Status: DC | PRN

## 2015-11-23 MED ORDER — ONDANSETRON HCL 4 MG PO TABS: 4.0000 mg | ORAL_TABLET | Freq: Four times a day (QID) | ORAL | Status: DC | PRN

## 2015-11-23 MED ORDER — LEVOTHYROXINE SODIUM 75 MCG PO TABS: 75.0000 ug | ORAL_TABLET | Freq: Every day | ORAL | Status: DC

## 2015-11-23 MED ORDER — SODIUM CHLORIDE 0.9 % IV SOLN
8.0000 mg/h | INTRAVENOUS | Status: DC
  Administered 2015-11-23: 8 mg/h via INTRAVENOUS
  Filled 2015-11-23: qty 80

## 2015-11-23 MED ORDER — SODIUM CHLORIDE 0.9 % IV BOLUS (SEPSIS)
1000.0000 mL | Freq: Once | INTRAVENOUS | Status: AC
  Administered 2015-11-23: 1000 mL via INTRAVENOUS

## 2015-11-23 MED ORDER — QUETIAPINE FUMARATE 25 MG PO TABS
200.0000 mg | ORAL_TABLET | Freq: Every day | ORAL | Status: DC
  Administered 2015-11-24 – 2015-11-25 (×2): 200 mg via ORAL
  Filled 2015-11-23 (×2): qty 8

## 2015-11-23 MED ORDER — ALUM & MAG HYDROXIDE-SIMETH 200-200-20 MG/5ML PO SUSP: 30.0000 mL | ORAL | Status: DC | PRN

## 2015-11-23 MED ORDER — ACETAMINOPHEN 325 MG PO TABS: 650.0000 mg | ORAL_TABLET | ORAL | Status: DC | PRN

## 2015-11-23 MED ORDER — ONDANSETRON HCL 4 MG/2ML IJ SOLN
4.0000 mg | Freq: Once | INTRAMUSCULAR | Status: AC
  Administered 2015-11-23: 4 mg via INTRAVENOUS
  Filled 2015-11-23: qty 2

## 2015-11-23 MED ORDER — CARBIDOPA-LEVODOPA ER 25-100 MG PO TBCR
1.0000 | EXTENDED_RELEASE_TABLET | Freq: Three times a day (TID) | ORAL | Status: DC
  Administered 2015-11-23 – 2015-11-25 (×3): 1 via ORAL
  Filled 2015-11-23 (×6): qty 1

## 2015-11-23 MED ORDER — VITAMIN D (ERGOCALCIFEROL) 1.25 MG (50000 UNIT) PO CAPS: 50000.0000 [IU] | ORAL_CAPSULE | ORAL | Status: DC

## 2015-11-23 MED ORDER — LEVOFLOXACIN IN D5W 750 MG/150ML IV SOLN
750.0000 mg | Freq: Once | INTRAVENOUS | Status: AC
  Administered 2015-11-23: 750 mg via INTRAVENOUS
  Filled 2015-11-23: qty 150

## 2015-11-23 MED ORDER — CLONAZEPAM 0.5 MG PO TABS
0.5000 mg | ORAL_TABLET | Freq: Three times a day (TID) | ORAL | Status: DC
  Administered 2015-11-23 – 2015-11-25 (×3): 0.5 mg via ORAL
  Filled 2015-11-23 (×4): qty 1

## 2015-11-23 MED ORDER — QUETIAPINE FUMARATE 400 MG PO TABS
400.0000 mg | ORAL_TABLET | Freq: Every day | ORAL | Status: DC
  Filled 2015-11-23 (×2): qty 1

## 2015-11-23 MED ORDER — MELATONIN 3 MG PO TABS
3.0000 mg | ORAL_TABLET | Freq: Every day | ORAL | Status: DC
  Filled 2015-11-23 (×2): qty 1

## 2015-11-23 MED ORDER — RESOURCE THICKENUP CLEAR PO POWD
ORAL | Status: DC | PRN
  Filled 2015-11-23: qty 125

## 2015-11-23 MED ORDER — IPRATROPIUM-ALBUTEROL 0.5-2.5 (3) MG/3ML IN SOLN: 3.0000 mL | RESPIRATORY_TRACT | Status: DC | PRN

## 2015-11-23 MED ORDER — SODIUM CHLORIDE 0.9 % IV SOLN
INTRAVENOUS | Status: DC
  Administered 2015-11-23: 21:00:00 via INTRAVENOUS

## 2015-11-23 MED ORDER — FAMOTIDINE IN NACL 20-0.9 MG/50ML-% IV SOLN: 20.0000 mg | Freq: Two times a day (BID) | INTRAVENOUS | Status: DC

## 2015-11-23 MED ORDER — LORATADINE 10 MG PO TABS
10.0000 mg | ORAL_TABLET | Freq: Every day | ORAL | Status: DC
  Administered 2015-11-24 – 2015-11-25 (×2): 10 mg via ORAL
  Filled 2015-11-23 (×2): qty 1

## 2015-11-23 MED ORDER — PANTOPRAZOLE SODIUM 40 MG IV SOLR: 40.0000 mg | Freq: Two times a day (BID) | INTRAVENOUS | Status: DC

## 2015-11-23 MED ORDER — PANTOPRAZOLE SODIUM 40 MG IV SOLR
40.0000 mg | Freq: Once | INTRAVENOUS | Status: AC
  Administered 2015-11-23: 40 mg via INTRAVENOUS
  Filled 2015-11-23 (×2): qty 40

## 2015-11-23 MED ORDER — DEXTROSE 5 % IV SOLN
1.0000 g | INTRAVENOUS | Status: DC
  Administered 2015-11-24 – 2015-11-25 (×2): 1 g via INTRAVENOUS
  Filled 2015-11-23 (×2): qty 10

## 2015-11-23 MED ORDER — CARBIDOPA-LEVODOPA ER 25-100 MG PO TBCR
1.0000 | EXTENDED_RELEASE_TABLET | Freq: Once | ORAL | Status: AC
Start: 2015-11-23 — End: 2015-11-23
  Administered 2015-11-23: 1 via ORAL
  Filled 2015-11-23: qty 1

## 2015-11-23 MED ORDER — ONDANSETRON HCL 4 MG/2ML IJ SOLN: 4.0000 mg | Freq: Four times a day (QID) | INTRAMUSCULAR | Status: DC | PRN

## 2015-11-23 MED ORDER — SERTRALINE HCL 100 MG PO TABS
100.0000 mg | ORAL_TABLET | Freq: Every day | ORAL | Status: DC
  Administered 2015-11-24 – 2015-11-25 (×2): 100 mg via ORAL
  Filled 2015-11-23 (×2): qty 1

## 2015-11-23 MED ORDER — HYDROCODONE-ACETAMINOPHEN 5-325 MG PO TABS: 1.0000 | ORAL_TABLET | ORAL | Status: DC | PRN

## 2015-11-23 MED ORDER — PANTOPRAZOLE SODIUM 40 MG IV SOLR
40.0000 mg | Freq: Two times a day (BID) | INTRAVENOUS | Status: DC
Start: 2015-11-23 — End: 2015-11-25
  Administered 2015-11-23 – 2015-11-24 (×3): 40 mg via INTRAVENOUS
  Filled 2015-11-23 (×3): qty 40

## 2015-11-23 MED ORDER — LAMOTRIGINE 100 MG PO TABS
200.0000 mg | ORAL_TABLET | Freq: Two times a day (BID) | ORAL | Status: DC
  Administered 2015-11-23 – 2015-11-25 (×3): 200 mg via ORAL
  Filled 2015-11-23: qty 1
  Filled 2015-11-23: qty 2
  Filled 2015-11-23: qty 1
  Filled 2015-11-23: qty 2

## 2015-11-23 NOTE — H&P (Signed)
History and Physical    Nicholas Fowler YNW:[id number] DOB: 16-Jun-1935 DOA: 11/23/2015  PCP: No primary care provider on file.  Patient coming from: SNF  Chief Complaint: vomited blood. Dark blood as per one family member , pt says just vomited.   HPI: Nicholas Fowler is a 80 y.o. male with medical history significant of parkinson's disease, dementia, copd, gerd, anxiety, fibromyalgia, presents to [facility] for hematemesis and was transferred to [facility] for further evaluation. Pt has dementia, and he can only tell me he had vomiting and family member at bedside reported that SNF reported that he had two episodes of hematemesis once last night and this am. Pt denies any other complaints.  His hemoglobin stable around 13. GI consulted for possible EGD in am. His stool for occult  Blood is positive. His labs were significant for abnormal wbc count and UA is positive for nitrites and wbc.  He was admitted to telemetry .   Review of Systems: could not be obtained as pt has dementia.   Past Medical History  Diagnosis Date  . COPD (chronic obstructive pulmonary disease) (HCC)   . Hyperlipidemia   . Anxiety   . Coronary artery disease   . GERD (gastroesophageal reflux disease)   . Parkinson's disease (HCC)   . UTI (lower urinary tract infection) 10/2015  . Coffee ground emesis 10/2015  . Tarry stools 10/2015  . Fibromyalgia   . Dementia   . TIA (transient ischemic attack)     Past Surgical History  Procedure Laterality Date  . Back surgery  2003  . Cataract extraction Bilateral      reports that he quit smoking about 7 years ago. He has never used smokeless tobacco. He reports that he does not drink alcohol or use illicit drugs.  Allergies  Allergen Reactions  . Penicillins Other (See Comments)    Listed on MAR    Family history; Pt has dementia and could not be obtained.   Prior to Admission medications   Medication Sig Start Date End Date Taking? Authorizing Provider    acetaminophen (TYLENOL) 325 MG tablet Take 650 mg by mouth every 4 (four) hours as needed for moderate pain or fever.    Yes Historical Provider, MD  alum & mag hydroxide-simeth (MAALOX PLUS) 400-400-40 MG/5ML suspension Take 30 mLs by mouth every 2 (two) hours as needed for indigestion.   Yes Historical Provider, MD  bisacodyl (DULCOLAX) 10 MG suppository Place 10 mg rectally every 3 (three) days. (hold if resident has BM)   Yes Historical Provider, MD  Carbidopa-Levodopa ER (SINEMET CR) 25-100 MG tablet controlled release Take 1 tablet by mouth 3 (three) times daily.   Yes Historical Provider, MD  Cholecalciferol (VITAMIN D3) 50000 units CAPS Take 50,000 Units by mouth every Friday.    Yes Historical Provider, MD  clonazePAM (KLONOPIN) 0.25 MG disintegrating tablet Take 0.5 mg by mouth 3 (three) times daily.   Yes Historical Provider, MD  Dextromethorphan-Guaifenesin (ROBAFEN DM) 10-100 MG/5ML liquid Take 10 mLs by mouth every 4 (four) hours as needed (for cough and chest congestion).    Yes Historical Provider, MD  ferrous sulfate 325 (65 FE) MG tablet Take 325 mg by mouth 3 (three) times daily with meals.   Yes Historical Provider, MD  HYDROcodone-acetaminophen (NORCO/VICODIN) 5-325 MG tablet Take 1 tablet by mouth every 4 (four) hours as needed for moderate pain.   Yes Historical Provider, MD  ipratropium-albuterol (DUONEB) 0.5-2.5 (3) MG/3ML SOLN Take 3 mLs by  nebulization as needed (for cough or congestion).    Yes Historical Provider, MD  lacosamide (VIMPAT) 200 MG TABS tablet Take 200 mg by mouth 2 (two) times daily.   Yes Historical Provider, MD  lamoTRIgine (LAMICTAL) 200 MG tablet Take 200 mg by mouth 2 (two) times daily.   Yes Historical Provider, MD  levothyroxine (SYNTHROID, LEVOTHROID) 75 MCG tablet Take 75 mcg by mouth daily before breakfast.   Yes Historical Provider, MD  loratadine (CLARITIN) 10 MG tablet Take 10 mg by mouth daily.   Yes Historical Provider, MD  LORazepam (ATIVAN)  0.5 MG tablet Take 1 mg by mouth every 6 (six) hours as needed for anxiety.   Yes Historical Provider, MD  LORazepam (ATIVAN) 2 MG tablet Take 2 mg by mouth every 8 (eight) hours as needed for anxiety. As needed for cluster seizure **max dose of 3 times/day** (or give IM if unable to take PO)   Yes Historical Provider, MD  Melatonin 3 MG TABS Take 3 mg by mouth at bedtime.   Yes Historical Provider, MD  omeprazole (PRILOSEC) 20 MG capsule Take 20 mg by mouth daily.   Yes Historical Provider, MD  promethazine (PHENERGAN) 25 MG/ML injection Inject 12.5 mg into the vein every 6 (six) hours as needed for nausea or vomiting.   Yes Historical Provider, MD  QUEtiapine (SEROQUEL) 100 MG tablet Take 200 mg by mouth daily.    Yes Historical Provider, MD  QUEtiapine (SEROQUEL) 400 MG tablet Take 400 mg by mouth at bedtime.   Yes Historical Provider, MD  senna-docusate (SENOKOT-S) 8.6-50 MG tablet Take 2 tablets by mouth daily.   Yes Historical Provider, MD  sertraline (ZOLOFT) 100 MG tablet Take 100 mg by mouth daily.   Yes Historical Provider, MD  vitamin B-12 (CYANOCOBALAMIN) 1000 MCG tablet Take 1,000 mcg by mouth daily.   Yes Historical Provider, MD    Physical Exam: Filed Vitals:   11/23/15 1712  BP: 116/64  Pulse: 86  Temp: 98.4 F (36.9 C)  TempSrc: Oral  Resp: 18  Height: 5\' 5"  (1.651 m)  Weight: 59.6 kg (131 lb 6.3 oz)  SpO2: 99%      Constitutional: NAD, calm, comfortable Filed Vitals:   11/23/15 1712  BP: 116/64  Pulse: 86  Temp: 98.4 F (36.9 C)  TempSrc: Oral  Resp: 18  Height: 5\' 5"  (1.651 m)  Weight: 59.6 kg (131 lb 6.3 oz)  SpO2: 99%   Eyes: PERRL, lids and conjunctivae normal ENMT: Mucous membranes are dry. Posterior pharynx clear of any exudate or lesions. Neck: normal, supple, no masses, no thyromegaly Respiratory: clear to auscultation bilaterally, no wheezing, no crackles. Normal respiratory effort. No accessory muscle use.  Cardiovascular: Regular rate and  rhythm, no murmurs / rubs / gallops. No extremity edema. 2+ pedal pulses. No carotid bruits.  Abdomen: no tenderness, no masses palpated. No hepatosplenomegaly. Bowel sounds positive.  Musculoskeletal: no clubbing / cyanosis. No joint deformity upper and lower extremities. Good ROM, no contractures. Normal muscle tone.  Skin: no rashes, lesions, ulcers. No induration Neurologic: alert but confused, able to move all extremities.     Labs on Admission: I have personally reviewed following labs and imaging studies  CBC:  Recent Labs Lab 11/23/15 1100  WBC 16.6*  NEUTROABS 13.8*  HGB 13.0  HCT 38.6*  MCV 89.1  PLT 367   Basic Metabolic Panel:  Recent Labs Lab 11/23/15 1100  NA 142  K 4.2  CL 109  CO2 27  GLUCOSE  115*  BUN 16  CREATININE 0.80  CALCIUM 9.1   GFR: Estimated Creatinine Clearance: 65.2 mL/min (by C-G formula based on Cr of 0.8). Liver Function Tests:  Recent Labs Lab 11/23/15 1100  AST 19  ALT 11*  ALKPHOS 63  BILITOT 0.2*  PROT 8.0  ALBUMIN 3.7   No results for input(s): LIPASE, AMYLASE in the last 168 hours. No results for input(s): AMMONIA in the last 168 hours. Coagulation Profile: No results for input(s): INR, PROTIME in the last 168 hours. Cardiac Enzymes:  Recent Labs Lab 11/23/15 1100  TROPONINI 0.08*   BNP (last 3 results) No results for input(s): PROBNP in the last 8760 hours. HbA1C: No results for input(s): HGBA1C in the last 72 hours. CBG: No results for input(s): GLUCAP in the last 168 hours. Lipid Profile: No results for input(s): CHOL, HDL, LDLCALC, TRIG, CHOLHDL, LDLDIRECT in the last 72 hours. Thyroid Function Tests: No results for input(s): TSH, T4TOTAL, FREET4, T3FREE, THYROIDAB in the last 72 hours. Anemia Panel: No results for input(s): VITAMINB12, FOLATE, FERRITIN, TIBC, IRON, RETICCTPCT in the last 72 hours. Urine analysis:    Component Value Date/Time   COLORURINE YELLOW* 11/23/2015 1134   COLORURINE Yellow  10/01/2013 1500   APPEARANCEUR CLOUDY* 11/23/2015 1134   APPEARANCEUR Clear 10/01/2013 1500   LABSPEC 1.018 11/23/2015 1134   LABSPEC 1.015 10/01/2013 1500   PHURINE 8.0 11/23/2015 1134   PHURINE 6.0 10/01/2013 1500   GLUCOSEU NEGATIVE 11/23/2015 1134   GLUCOSEU Negative 10/01/2013 1500   HGBUR NEGATIVE 11/23/2015 1134   HGBUR Negative 10/01/2013 1500   BILIRUBINUR NEGATIVE 11/23/2015 1134   BILIRUBINUR Negative 10/01/2013 1500   KETONESUR NEGATIVE 11/23/2015 1134   KETONESUR Trace 10/01/2013 1500   PROTEINUR 30* 11/23/2015 1134   PROTEINUR Negative 10/01/2013 1500   NITRITE POSITIVE* 11/23/2015 1134   NITRITE Negative 10/01/2013 1500   LEUKOCYTESUR 1+* 11/23/2015 1134   LEUKOCYTESUR Negative 10/01/2013 1500   Sepsis Labs: !!!!!!!!!!!!!!!!!!!!!!!!!!!!!!!!!!!!!!!!!!!! (procalcitonin:4,lacticidven:4) )No results found for this or any previous visit (from the past 240 hour(s)).   Radiological Exams on Admission: No results found.  EKG: sinus rhythm,   Assessment/Plan Active Problems:   Hematemesis   COPD exacerbation (HCC)   UTI (urinary tract infection)   Dementia   GERD (gastroesophageal reflux disease)   Hyperlipidemia   Anxiety state   Hematemesis:/ ? Tarry stools.: poor historian. ? Coffee ground emesis, unclear history as pt appears to have dementia.  IV protonix 40 mg BID, NPO after midnight.  Discussed with GI, .  Nectar thick liquids for tonight.  H&H every 8 hours.    H/o dysphagia on dysphagia 1 diet with nectar thick liquids. Will get a CXR to see if he had aspirated.     UTI: Urine cultures ordered and started on IV Rocephin.     Parkinson's disease: - resume home meds.    Leukocytosis probably from the UTI.   Dementia; No behavioral abnormalities.   Slightly elevated troponin: He denies chest pain, or sob, would not follow troponin's, EKG does not show any ischemic changes.   DVT prophylaxis: scd's Code Status:DNR Family  Communication: FAMILY AT BEDSIDE. Disposition Plan: pending further eval. Consults called: [facility] Admission status: inpatient tele   Joshuah Minella MD [facility] Pager [phone]   If 7PM-7AM, please contact night-coverage [email or web address] Password [facility]  11/23/2015, 7:57 PM

## 2015-11-23 NOTE — ED Notes (Signed)
Pt here from [facility] via [facility] with 2 episodes of coffee ground emesis since last night. Pt also reports 3 days of black stools. Pt with hx of parkinsons, Alert upon arrival.

## 2015-11-23 NOTE — ED Provider Notes (Signed)
[facility] Emergency Department Provider Note   ____________________________________________  Time seen: Approximately 11am I have reviewed the triage vital signs and the triage nursing note.  HISTORY  Chief Complaint Hematemesis   Historian Limited Patient poor historian EMS report and nursing home call  HPI Nicholas Fowler is a 80 y.o. male with history of Parkinson's disease, and COPD, is here after reportedly 3 days of black stools and vomiting 2 overnight reported as coffee ground emesis. He is not on aspirin or any blood thinners. No apparent history of GI bleeding or ulcers in the past. Patient is not reporting any chest pain or abdominal pain. No reported fevers. Again the patient is a very poor historian himself.   [facility] -- patient is at MS baseline.   Emesis was checked on gastroccult and positive.   Past Medical History  Diagnosis Date  . COPD (chronic obstructive pulmonary disease) (HCC)   . Hyperlipidemia   . Anxiety   . Coronary artery disease   . GERD (gastroesophageal reflux disease)   . Parkinson's disease (HCC)     There are no active problems to display for this patient.   No past surgical history on file.  Current Outpatient Rx  Name  Route  Sig  Dispense  Refill  . acetaminophen (TYLENOL) 325 MG tablet   Oral   Take 650 mg by mouth every 4 (four) hours as needed.         Marland Kitchen. alum & mag hydroxide-simeth (MAALOX PLUS) 400-400-40 MG/5ML suspension   Oral   Take 30 mLs by mouth every 2 (two) hours as needed for indigestion.         . bisacodyl (DULCOLAX) 10 MG suppository   Rectal   Place 10 mg rectally every 3 (three) days. (hold if resident has BM)         . Carbidopa-Levodopa ER (SINEMET CR) 25-100 MG tablet controlled release   Oral   Take 1 tablet by mouth 3 (three) times daily.         . Cholecalciferol (VITAMIN D3) 50000 units CAPS   Oral   Take 50,000 Units by mouth once a week.          . clonazePAM (KLONOPIN) 0.5 MG tablet   Oral   Take 0.5 mg by mouth 3 (three) times daily.         Marland Kitchen. Dextromethorphan-Guaifenesin (ROBAFEN DM) 10-100 MG/5ML liquid   Oral   Take 10 mLs by mouth every 4 (four) hours as needed.         . ferrous sulfate 325 (65 FE) MG tablet   Oral   Take 325 mg by mouth 3 (three) times daily with meals.         Marland Kitchen. HYDROcodone-acetaminophen (NORCO/VICODIN) 5-325 MG tablet   Oral   Take 1 tablet by mouth every 4 (four) hours as needed for moderate pain.         Marland Kitchen. ipratropium-albuterol (DUONEB) 0.5-2.5 (3) MG/3ML SOLN   Nebulization   Take 3 mLs by nebulization as needed.         . lacosamide (VIMPAT) 200 MG TABS tablet   Oral   Take 200 mg by mouth 2 (two) times daily.         Marland Kitchen. lamoTRIgine (LAMICTAL) 200 MG tablet   Oral   Take 200 mg by mouth 2 (two) times daily.         Marland Kitchen. levothyroxine (SYNTHROID, LEVOTHROID) 75 MCG tablet  Oral   Take 75 mcg by mouth daily before breakfast.         . loratadine (CLARITIN) 10 MG tablet   Oral   Take 10 mg by mouth daily.         Marland Kitchen LORazepam (ATIVAN) 0.5 MG tablet   Oral   Take 1 mg by mouth every 6 (six) hours as needed for anxiety.         Marland Kitchen LORazepam (ATIVAN) 2 MG tablet   Oral   Take 2 mg by mouth every 8 (eight) hours as needed for anxiety. As needed for cluster seizure **max dose of 3 times/day** (or give IM if unable to take PO)         . Melatonin 3 MG TABS   Oral   Take 3 mg by mouth at bedtime.         Marland Kitchen omeprazole (PRILOSEC) 20 MG capsule   Oral   Take 20 mg by mouth daily.         . promethazine (PHENERGAN) 25 MG/ML injection   Intravenous   Inject 12.5 mg into the vein every 6 (six) hours as needed for nausea or vomiting.         Marland Kitchen QUEtiapine (SEROQUEL) 100 MG tablet   Oral   Take 100 mg by mouth daily.         . QUEtiapine (SEROQUEL) 400 MG tablet   Oral   Take 400 mg by mouth at bedtime.         . senna-docusate (SENOKOT-S) 8.6-50  MG tablet   Oral   Take 2 tablets by mouth daily.         . sertraline (ZOLOFT) 100 MG tablet   Oral   Take 100 mg by mouth daily.         . vitamin B-12 (CYANOCOBALAMIN) 1000 MCG tablet   Oral   Take 1,000 mcg by mouth daily.           Allergies Penicillins  No family history on file.  Social History Social History  Substance Use Topics  . Smoking status: None  . Smokeless tobacco: None  . Alcohol Use: None    Review of Systems Limited due to patient's a poor historian, but these are his answers. Constitutional: Negative for fever. Eyes: Negative for visual changes. ENT: Negative for sore throat. Cardiovascular: Negative for chest pain. Respiratory: Negative for shortness of breath. Gastrointestinal: Negative for abdominal pain Genitourinary: Negative for dysuria. Musculoskeletal: Negative for back pain. Skin: Negative for rash. Neurological: Negative for headache. 10 point Review of Systems otherwise negative ____________________________________________   PHYSICAL EXAM:  VITAL SIGNS: ED Triage Vitals  Enc Vitals Group     BP 11/23/15 1059 143/81 mmHg     Pulse Rate 11/23/15 1059 102     Resp 11/23/15 1059 22     Temp 11/23/15 1059 98 F (36.7 C)     Temp Source 11/23/15 1059 Oral     SpO2 11/23/15 1059 94 %     Weight --      Height --      Head Cir --      Peak Flow --      Pain Score --      Pain Loc --      Pain Edu? --      Excl. in GC? --      Constitutional: Alert and Cooperative, poor historian. Well appearing overall and in no distress.  HEENT   Head:  Normocephalic and atraumatic.      Eyes: Conjunctivae are normal. PERRL. Normal extraocular movements.      Ears:         Nose: No congestion/rhinnorhea.   Mouth/Throat: Mucous membranes are mildly dry. Some brown crusting on his mouth. Teeth missing.   Neck: No stridor. Cardiovascular/Chest: Normal rate, regular rhythm.  No murmurs, rubs, or gallops. Respiratory: Normal  respiratory effort without tachypnea nor retractions. Breath sounds are clear and equal bilaterally. No wheezes/rales/rhonchi. Gastrointestinal: Soft. No distention, no guarding, no rebound. Nontender.  Genitourinary/rectal: Black stool, Hemoccult testing was negative Musculoskeletal: Nontender with normal range of motion in all extremities. No joint effusions.  No lower extremity tenderness.  No edema. Neurologic:  No facial droop.  No gross or focal neurologic deficits are appreciated. Skin:  Skin is warm, dry and intact. No rash noted. Psychiatric: No hallucinations  ____________________________________________   EKG I, Governor Rooksebecca Venita Seng, MD, the attending physician have personally viewed and interpreted all ECGs.  94 bpm.  NSR.  Narrow qrs.  Nl st and t wave ____________________________________________  LABS (pertinent positives/negatives)  Labs Reviewed  CBC WITH DIFFERENTIAL/PLATELET - Abnormal; Notable for the following:    WBC 16.6 (*)    RBC 4.33 (*)    HCT 38.6 (*)    RDW 15.6 (*)    Neutro Abs 13.8 (*)    All other components within normal limits  COMPREHENSIVE METABOLIC PANEL - Abnormal; Notable for the following:    Glucose, Bld 115 (*)    ALT 11 (*)    Total Bilirubin 0.2 (*)    All other components within normal limits  TROPONIN I - Abnormal; Notable for the following:    Troponin I 0.08 (*)    All other components within normal limits  URINALYSIS COMPLETEWITH MICROSCOPIC ([facility] ONLY) - Abnormal; Notable for the following:    Color, Urine YELLOW (*)    APPearance CLOUDY (*)    Protein, ur 30 (*)    Nitrite POSITIVE (*)    Leukocytes, UA 1+ (*)    All other components within normal limits  URINE CULTURE  CULTURE, BLOOD (ROUTINE X 2)  CULTURE, BLOOD (ROUTINE X 2)  LACTIC ACID, PLASMA  LACTIC ACID, PLASMA    ____________________________________________  RADIOLOGY All Xrays were viewed by me. Imaging interpreted by  Radiologist.  None __________________________________________  PROCEDURES  Procedure(s) performed: None  Critical Care performed: None  ____________________________________________   ED COURSE / ASSESSMENT AND PLAN  Pertinent labs & imaging results that were available during my care of the patient were reviewed by me and considered in my medical decision making (see chart for details).   I was able to get in touch with one of the nursing home staff, the patient is at mental status baseline.  I was also able to confirm with them it sounds like his emesis was tested on a gastric occult card and positive for blood.  His Hemoccult for stool testing here was negative despite still being black colored.  He had a heart rate of around up to 105, but at rest in the mid 90s. He's had no hypotension. He's had no reported fever nor fever here. His urinalysis is consistent with nitrite positive urinary tract infection. I am going to initiate Levaquin given report of unknown penicillin allergy, after blood and urine cultures. I'm adding on a lactate for stratification in terms of risk for sepsis. This point time no fever, no persistent tachycardia and no hypotension, I'm not concerned for sepsis  at this point.  In terms of the hematemesis, he has not had any additional emesis here. His stools were black but heme negative. I will go ahead and place him on Protonix overnight while he is being monitored in the hospital.  History of blood and was minimally elevated 0.08, this is a change from his previous baselines which are less than 0.02. At this point without a complaint of chest pain, EKG changes, and possibility of GI bleeding, I am not going to give him aspirin.      CONSULTATIONS:   I spoke with [place] hospitalist, Dr. Hilton Sinclair -- discussed that although patient is stable from a GI bleeding standpoint, if patient were to decompensate, we do not have GI coverage right now and I will plan to  transfer him.  I spoke with admitting hospitalist, Dr. Margot Ables at [facility] who does accept patient in transfer for observation. He requested I speak with on-call GI at [facility], and I spoke with Dr. Lavon Paganini who is aware about possible gi consultation.   Patient / Family / Caregiver informed of clinical course, medical decision-making process, and agree with plan.     ___________________________________________   FINAL CLINICAL IMPRESSION(S) / ED DIAGNOSES   Final diagnoses:  UTI (lower urinary tract infection)  Troponin I above reference range  Hematemesis, presence of nausea not specified              Note: This dictation was prepared with Dragon dictation. Any transcriptional errors that result from this process are unintentional   Governor Rooks, MD 11/23/15 1520

## 2015-11-24 ENCOUNTER — Encounter (HOSPITAL_COMMUNITY): Payer: Self-pay | Admitting: Physician Assistant

## 2015-11-24 DIAGNOSIS — K920 Hematemesis: Principal | ICD-10-CM

## 2015-11-24 LAB — COMPREHENSIVE METABOLIC PANEL
ALBUMIN: 2.9 g/dL — AB (ref 3.5–5.0)
ALK PHOS: 49 U/L (ref 38–126)
ALT: <5 U/L — ABNORMAL LOW (ref 17–63)
AST: 18 U/L (ref 15–41)
Anion gap: 6 (ref 5–15)
BILIRUBIN TOTAL: 0.5 mg/dL (ref 0.3–1.2)
BUN: 14 mg/dL (ref 6–20)
CALCIUM: 8.7 mg/dL — AB (ref 8.9–10.3)
CO2: 23 mmol/L (ref 22–32)
Chloride: 109 mmol/L (ref 101–111)
Creatinine, Ser: 0.69 mg/dL (ref 0.61–1.24)
GFR calc Af Amer: >60 mL/min (ref >60)
GFR calc non Af Amer: >60 mL/min (ref >60)
GLUCOSE: 85 mg/dL (ref 65–99)
POTASSIUM: 3.8 mmol/L (ref 3.5–5.1)
Sodium: 138 mmol/L (ref 135–145)
TOTAL PROTEIN: 6.9 g/dL (ref 6.5–8.1)

## 2015-11-24 LAB — HEMOGLOBIN AND HEMATOCRIT, BLOOD
HCT: 34.5 % — ABNORMAL LOW (ref 39.0–52.0)
Hemoglobin: 10.9 g/dL — ABNORMAL LOW (ref 13.0–17.0)

## 2015-11-24 LAB — MRSA PCR SCREENING: MRSA BY PCR: NEGATIVE

## 2015-11-24 LAB — TROPONIN I
TROPONIN I: 0.07 ng/mL — AB (ref <0.03)
TROPONIN I: 0.07 ng/mL — AB (ref <0.03)

## 2015-11-24 MED ORDER — SODIUM CHLORIDE 0.9 % IV SOLN: INTRAVENOUS | Status: DC

## 2015-11-24 MED ORDER — LORAZEPAM 2 MG/ML IJ SOLN
0.5000 mg | Freq: Once | INTRAMUSCULAR | Status: AC
  Administered 2015-11-25: 0.5 mg via INTRAVENOUS
  Filled 2015-11-24: qty 1

## 2015-11-24 MED ORDER — LORAZEPAM 2 MG/ML IJ SOLN: 0.5000 mg | Freq: Once | INTRAMUSCULAR | Status: DC

## 2015-11-24 MED ORDER — SODIUM CHLORIDE 0.9 % IV SOLN
INTRAVENOUS | Status: DC
  Administered 2015-11-24: 17:00:00 via INTRAVENOUS

## 2015-11-24 MED ORDER — STARCH (THICKENING) PO POWD
ORAL | Status: DC | PRN
  Filled 2015-11-24: qty 227

## 2015-11-24 NOTE — Progress Notes (Signed)
PROGRESS NOTE    Nicholas Fowler  ONG:[id number] DOB: 1935/06/15 DOA: 11/23/2015 PCP: No primary care provider on file.   Brief Narrative:  HPI on 11/23/2015 by Dr. Kathlen ModyVijaya Akula Nicholas Fowler is a 80 y.o. male with medical history significant of parkinson's disease, dementia, copd, gerd, anxiety, fibromyalgia, presents to [facility] for hematemesis and was transferred to [facility] for further evaluation. Pt has dementia, and he can only tell me he had vomiting and family member at bedside reported that SNF reported that he had two episodes of hematemesis once last night and this am. Pt denies any other complaints.  His hemoglobin stable around 13. GI consulted for possible EGD in am. His stool for occult Blood is positive. His labs were significant for abnormal wbc count and UA is positive for nitrites and wbc.  He was admitted to telemetry .   Assessment & Plan   Hematemesis/Upper GI bleed/Blood loss anemia -Poor historian, ? Coffee ground emesis -Gastroccult+, FOBT neg -Continue IV protonix BID -Gastroenterology consulted and appreciated -Plan for EGD tomorrow, 11/25/2015 -Hemoglobin currently 11 (was 13 upon admission, no prior records for comparison) -Continue to monitor   H/o dysphagia  -on dysphagia 1 diet with nectar thick liquids. -Currently on clear -CXR unremarkable     UTI -Continue rocephin -Urine culture: 60K colonies GNR   Parkinson's disease/Dementia -Continue home meds -no behavioral issues, sitter  Leukocytosis -probably from the UTI vs reactive -Continue to monitor CBC  Mildly elevated troponin -He denies chest pain, or sob -EKG does not show any ischemic changes -Cycle trops  DVT Prophylaxis  SCDs  Code Status: DNR  Family Communication: None at bedside  Disposition Plan: Admitted, pending GI recommendations-EGD on 6/29  Consultants Gastroenterology  Procedures  None  Antibiotics   Anti-infectives    Start     Dose/Rate Route  Frequency Ordered Stop   11/24/15 0000  cefTRIAXone (ROCEPHIN) 1 g in dextrose 5 % 50 mL IVPB     1 g 100 mL/hr over 30 Minutes Intravenous Every 24 hours 11/23/15 1902        Subjective:   Nicholas Fowler seen and examined today.  No complaints. Poor historian.    Objective:   Filed Vitals:   11/23/15 1712 11/23/15 2209  BP: 116/64 123/68  Pulse: 86 85  Temp: 98.4 F (36.9 C) 97.9 F (36.6 C)  TempSrc: Oral Oral  Resp: 18 18  Height: 5\' 5"  (1.651 m)   Weight: 59.6 kg (131 lb 6.3 oz)   SpO2: 99% 100%    Intake/Output Summary (Last 24 hours) at 11/24/15 1320 Last data filed at 11/24/15 0745  Gross per 24 hour  Intake    120 ml  Output      0 ml  Net    120 ml   Filed Weights   11/23/15 1712  Weight: 59.6 kg (131 lb 6.3 oz)    Exam  General: Well developed, thin, elderly, NAD  HEENT: NCAT, mucous membranes moist.   Cardiovascular: S1 S2 auscultated, no murmurs, RRR  Respiratory: Clear to auscultation bilaterally   Abdomen: Soft, nontender, nondistended, + bowel sounds  Extremities: warm dry without cyanosis clubbing or edema  Neuro: AAOx1 (self), dementia, follows commands.  Data Reviewed: I have personally reviewed following labs and imaging studies  CBC:  Recent Labs Lab 11/23/15 1100 11/23/15 2035  WBC 16.6*  --   NEUTROABS 13.8*  --   HGB 13.0 11.0*  HCT 38.6* 35.1*  MCV 89.1  --  PLT 367  --    Basic Metabolic Panel:  Recent Labs Lab 11/23/15 1100 11/24/15 0350  NA 142 138  K 4.2 3.8  CL 109 109  CO2 27 23  GLUCOSE 115* 85  BUN 16 14  CREATININE 0.80 0.69  CALCIUM 9.1 8.7*   GFR: Estimated Creatinine Clearance: 65.2 mL/min (by C-G formula based on Cr of 0.69). Liver Function Tests:  Recent Labs Lab 11/23/15 1100 11/24/15 0350  AST 19 18  ALT 11* <5*  ALKPHOS 63 49  BILITOT 0.2* 0.5  PROT 8.0 6.9  ALBUMIN 3.7 2.9*   No results for input(s): LIPASE, AMYLASE in the last 168 hours. No results for input(s): AMMONIA in  the last 168 hours. Coagulation Profile: No results for input(s): INR, PROTIME in the last 168 hours. Cardiac Enzymes:  Recent Labs Lab 11/23/15 1100  TROPONINI 0.08*   BNP (last 3 results) No results for input(s): PROBNP in the last 8760 hours. HbA1C: No results for input(s): HGBA1C in the last 72 hours. CBG: No results for input(s): GLUCAP in the last 168 hours. Lipid Profile: No results for input(s): CHOL, HDL, LDLCALC, TRIG, CHOLHDL, LDLDIRECT in the last 72 hours. Thyroid Function Tests: No results for input(s): TSH, T4TOTAL, FREET4, T3FREE, THYROIDAB in the last 72 hours. Anemia Panel: No results for input(s): VITAMINB12, FOLATE, FERRITIN, TIBC, IRON, RETICCTPCT in the last 72 hours. Urine analysis:    Component Value Date/Time   COLORURINE YELLOW* 11/23/2015 1134   COLORURINE Yellow 10/01/2013 1500   APPEARANCEUR CLOUDY* 11/23/2015 1134   APPEARANCEUR Clear 10/01/2013 1500   LABSPEC 1.018 11/23/2015 1134   LABSPEC 1.015 10/01/2013 1500   PHURINE 8.0 11/23/2015 1134   PHURINE 6.0 10/01/2013 1500   GLUCOSEU NEGATIVE 11/23/2015 1134   GLUCOSEU Negative 10/01/2013 1500   HGBUR NEGATIVE 11/23/2015 1134   HGBUR Negative 10/01/2013 1500   BILIRUBINUR NEGATIVE 11/23/2015 1134   BILIRUBINUR Negative 10/01/2013 1500   KETONESUR NEGATIVE 11/23/2015 1134   KETONESUR Trace 10/01/2013 1500   PROTEINUR 30* 11/23/2015 1134   PROTEINUR Negative 10/01/2013 1500   NITRITE POSITIVE* 11/23/2015 1134   NITRITE Negative 10/01/2013 1500   LEUKOCYTESUR 1+* 11/23/2015 1134   LEUKOCYTESUR Negative 10/01/2013 1500   Sepsis Labs: (procalcitonin:4,lacticidven:4)  ) Recent Results (from the past 240 hour(s))  Urine culture     Status: Abnormal (Preliminary result)   Collection Time: 11/23/15 11:34 AM  Result Value Ref Range Status   Specimen Description URINE, RANDOM  Final   Special Requests NONE  Final   Culture 60,000 COLONIES/mL GRAM NEGATIVE RODS (A)  Final   Report  Status PENDING  Incomplete  Culture, blood (routine x 2)     Status: None (Preliminary result)   Collection Time: 11/23/15 12:28 PM  Result Value Ref Range Status   Specimen Description BLOOD LEFT AC  Final   Special Requests   Final    BOTTLES DRAWN AEROBIC AND ANAEROBIC AER ANA   Culture NO GROWTH < 24 HOURS  Final   Report Status PENDING  Incomplete  Culture, blood (routine x 2)     Status: None (Preliminary result)   Collection Time: 11/23/15 12:33 PM  Result Value Ref Range Status   Specimen Description BLOOD RIGHT AC  Final   Special Requests   Final    BOTTLES DRAWN AEROBIC AND ANAEROBIC AER ANA   Culture NO GROWTH < 24 HOURS  Final   Report Status PENDING  Incomplete  MRSA PCR Screening  Status: None   Collection Time: 11/24/15  7:06 AM  Result Value Ref Range Status   MRSA by PCR NEGATIVE NEGATIVE Final    Comment:        The GeneXpert MRSA Assay (FDA approved for NASAL specimens only), is one component of a comprehensive MRSA colonization surveillance program. It is not intended to diagnose MRSA infection nor to guide or monitor treatment for MRSA infections.       Radiology Studies: Dg Chest Port 1 View  11/23/2015  CLINICAL DATA:  Aspiration. EXAM: PORTABLE CHEST 1 VIEW COMPARISON:  None. FINDINGS: The heart, hila, mediastinum, lungs, and pleura are unremarkable. Mildly tortuous thoracic aorta. IMPRESSION: No active disease. Electronically Signed   By: Gerome Samavid  Williams III M.D   On: 11/23/2015 22:38     Scheduled Meds: . Carbidopa-Levodopa ER  1 tablet Oral TID  . cefTRIAXone (ROCEPHIN)  IV  1 g Intravenous Q24H  . clonazePAM  0.5 mg Oral TID  . lacosamide (VIMPAT) IV  200 mg Intravenous Q12H  . lamoTRIgine  200 mg Oral BID  . levothyroxine  75 mcg Oral QAC breakfast  . loratadine  10 mg Oral Daily  . Melatonin  3 mg Oral QHS  . pantoprazole (PROTONIX) IV  40 mg Intravenous Q12H  . QUEtiapine  200 mg Oral Daily  . QUEtiapine  400 mg  Oral QHS  . sertraline  100 mg Oral Daily  . [START ON 11/26/2015] Vitamin D (Ergocalciferol)  50,000 Units Oral Q Fri   Continuous Infusions: . sodium chloride 75 mL/hr at 11/23/15 2101     LOS: 1 day   Time Spent in minutes   30 minutes  Layonna Dobie D.O. on 11/24/2015 at 1:20 PM  Between 7am to 7pm - Pager - [phone]  After 7pm go to [email or web address] - password [email or web address]  And look for the night coverage person covering for me after hours  [facility] Office  [phone]

## 2015-11-24 NOTE — Progress Notes (Signed)
Critical lab called triponin 0.07. Md paged. No orders given. Will continue monitoring pt.

## 2015-11-24 NOTE — Consult Note (Signed)
[facility] Consult: 9:22 AM 11/24/2015  LOS: 1 day    Referring Provider: Dr Catha Gosselin  Primary Care Physician:  Ignacia Bayley of [facility] in [place] Primary Gastroenterologist:  Gentry Fitz.       Reason for Consultation:  CG emesis and black stools.    HPI: Nicholas Fowler is a 80 y.o. male.  Pt hx COPD, Parkinsons, dementia, Fibromyalgia, anxiety, hypothyroidism, TIA and CVA, seizures.  On po Iron, 20 mg Omeprazole at [facility] home.  Previously on Plavix but not currently.  No NSAIDs.  Note insurance listed as "[facility]" and pt is DNR.   Admitted through ED with 3 days black stools,  CG emesis onset evening 6/27.  Came from [facility] ED.  BP 116/64, pulse 80s.  Emesis gastroccult +, the black stool was FOBT negative.  Hgb 13... 11, previously 15 in 12/2013.  Normal MCV, BUN, coags. Marland Kitchen BID IV Protonix in place.    Unable to get much history from pt.  Unknown if he ever had colonoscopy or EGD.  Unable to reach his wife.  Pt denies abdominal or other pain.  RN reports no CG emesis or stools since arrival to floor.        Past Medical History  Diagnosis Date  . COPD (chronic obstructive pulmonary disease) (HCC)   . Hyperlipidemia   . Anxiety   . Coronary artery disease   . GERD (gastroesophageal reflux disease)   . Parkinson's disease (HCC)   . UTI (lower urinary tract infection) 10/2015  . Coffee ground emesis 10/2015  . Tarry stools 10/2015  . Fibromyalgia   . Dementia   . TIA (transient ischemic attack)     Past Surgical History  Procedure Laterality Date  . Back surgery  2003  . Cataract extraction Bilateral     Prior to Admission medications   Medication Sig Start Date End Date Taking? Authorizing Provider  acetaminophen (TYLENOL) 325 MG tablet Take 650 mg by mouth every 4 (four)  hours as needed for moderate pain or fever.    Yes Historical Provider, MD  alum & mag hydroxide-simeth (MAALOX PLUS) 400-400-40 MG/5ML suspension Take 30 mLs by mouth every 2 (two) hours as needed for indigestion.   Yes Historical Provider, MD  bisacodyl (DULCOLAX) 10 MG suppository Place 10 mg rectally every 3 (three) days. (hold if resident has BM)   Yes Historical Provider, MD  Carbidopa-Levodopa ER (SINEMET CR) 25-100 MG tablet controlled release Take 1 tablet by mouth 3 (three) times daily.   Yes Historical Provider, MD  Cholecalciferol (VITAMIN D3) 50000 units CAPS Take 50,000 Units by mouth every Friday.    Yes Historical Provider, MD  clonazePAM (KLONOPIN) 0.25 MG disintegrating tablet Take 0.5 mg by mouth 3 (three) times daily.   Yes Historical Provider, MD  Dextromethorphan-Guaifenesin (ROBAFEN DM) 10-100 MG/5ML liquid Take 10 mLs by mouth every 4 (four) hours as needed (for cough and chest congestion).    Yes Historical Provider, MD  ferrous sulfate 325 (65 FE) MG tablet Take 325 mg by mouth 3 (three) times daily  with meals.   Yes Historical Provider, MD  HYDROcodone-acetaminophen (NORCO/VICODIN) 5-325 MG tablet Take 1 tablet by mouth every 4 (four) hours as needed for moderate pain.   Yes Historical Provider, MD  ipratropium-albuterol (DUONEB) 0.5-2.5 (3) MG/3ML SOLN Take 3 mLs by nebulization as needed (for cough or congestion).    Yes Historical Provider, MD  lacosamide (VIMPAT) 200 MG TABS tablet Take 200 mg by mouth 2 (two) times daily.   Yes Historical Provider, MD  lamoTRIgine (LAMICTAL) 200 MG tablet Take 200 mg by mouth 2 (two) times daily.   Yes Historical Provider, MD  levothyroxine (SYNTHROID, LEVOTHROID) 75 MCG tablet Take 75 mcg by mouth daily before breakfast.   Yes Historical Provider, MD  loratadine (CLARITIN) 10 MG tablet Take 10 mg by mouth daily.   Yes Historical Provider, MD  LORazepam (ATIVAN) 0.5 MG tablet Take 1 mg by mouth every 6 (six) hours as needed for anxiety.    Yes Historical Provider, MD  LORazepam (ATIVAN) 2 MG tablet Take 2 mg by mouth every 8 (eight) hours as needed for anxiety. As needed for cluster seizure **max dose of 3 times/day** (or give IM if unable to take PO)   Yes Historical Provider, MD  Melatonin 3 MG TABS Take 3 mg by mouth at bedtime.   Yes Historical Provider, MD  omeprazole (PRILOSEC) 20 MG capsule Take 20 mg by mouth daily.   Yes Historical Provider, MD  promethazine (PHENERGAN) 25 MG/ML injection Inject 12.5 mg into the vein every 6 (six) hours as needed for nausea or vomiting.   Yes Historical Provider, MD  QUEtiapine (SEROQUEL) 100 MG tablet Take 200 mg by mouth daily.    Yes Historical Provider, MD  QUEtiapine (SEROQUEL) 400 MG tablet Take 400 mg by mouth at bedtime.   Yes Historical Provider, MD  senna-docusate (SENOKOT-S) 8.6-50 MG tablet Take 2 tablets by mouth daily.   Yes Historical Provider, MD  sertraline (ZOLOFT) 100 MG tablet Take 100 mg by mouth daily.   Yes Historical Provider, MD  vitamin B-12 (CYANOCOBALAMIN) 1000 MCG tablet Take 1,000 mcg by mouth daily.   Yes Historical Provider, MD    Scheduled Meds: . Carbidopa-Levodopa ER  1 tablet Oral TID  . cefTRIAXone (ROCEPHIN)  IV  1 g Intravenous Q24H  . clonazePAM  0.5 mg Oral TID  . lacosamide (VIMPAT) IV  200 mg Intravenous Q12H  . lamoTRIgine  200 mg Oral BID  . levothyroxine  75 mcg Oral QAC breakfast  . loratadine  10 mg Oral Daily  . Melatonin  3 mg Oral QHS  . pantoprazole (PROTONIX) IV  40 mg Intravenous Q12H  . QUEtiapine  200 mg Oral Daily  . QUEtiapine  400 mg Oral QHS  . sertraline  100 mg Oral Daily  . [START ON 11/26/2015] Vitamin D (Ergocalciferol)  50,000 Units Oral Q Fri   Infusions: . sodium chloride 75 mL/hr at 11/23/15 2101   PRN Meds: acetaminophen, alum & mag hydroxide-simeth, HYDROcodone-acetaminophen, ipratropium-albuterol, LORazepam, ondansetron **OR** ondansetron (ZOFRAN) IV, RESOURCE THICKENUP CLEAR   Allergies as of 11/23/2015  - Review Complete 11/23/2015  Allergen Reaction Noted  . Penicillins Other (See Comments) 11/23/2015    History reviewed. No pertinent family history.  Social History   Social History  . Marital Status: Married    Spouse Name: N/A  . Number of Children: N/A  . Years of Education: N/A   Occupational History  . Not on file.   Social History Main Topics  . Smoking  status: Former Smoker    Quit date: 05/29/2008  . Smokeless tobacco: Never Used  . Alcohol Use: No  . Drug Use: No  . Sexual Activity: Not on file   Other Topics Concern  . Not on file   Social History Narrative    REVIEW OF SYSTEMS: Constitutional:  Unable to get this info.   ENT:  No nose bleeds Pulm:  No SOB or cough CV:  No palpitations, no LE edema.  GU:  No hematuria, no frequency GI:  Denies abd pain or nausea Heme:  Bleeding tendencies unknown   Transfusions:  unknown Neuro:  No headaches, no peripheral tingling or numbness Psych:  Sitter tells me he has tried to get out of bed.  Derm:  No itching, no rash or sores.  Endocrine:  No sweats or chills.  No polyuria or dysuria Immunization:  unknown Travel:  None    PHYSICAL EXAM: Vital signs in last 24 hours: Filed Vitals:   11/23/15 1712 11/23/15 2209  BP: 116/64 123/68  Pulse: 86 85  Temp: 98.4 F (36.9 C) 97.9 F (36.6 C)  Resp: 18 18   Wt Readings from Last 3 Encounters:  11/23/15 59.6 kg (131 lb 6.3 oz)    General: frail, thin, elderly WM Head:  No asymmetry, swelling or signs trauma  Eyes:  No icterus or pallor Ears:  Not HOH.    Nose:  No congestion or discharge Mouth:  Few teeth remain, in poor repair.  Moist and clear oral MM.  Tongue tremors but not asymmetric.  Neck:  No TMG, no mass, no JVD Lungs:  Clear bil.  No cough or labored breathing Heart: RRR.  No mrg.  S1/s2 present.  Abdomen:  Soft, NT, ND.  No mass.  No HSM.  Active BS.   Rectal: deferred   Musc/Skeltl: no joint swelling, tenderness or redness.  Arthritic  changes in fingers Extremities:  No CCE  Neurologic:  Knows his name, tells me he is middle child and has/had 2 btothers.  Not oriented to place.  Follows most, but not every command.  Moves all 4s.  + UE and slight head tremor Skin:  No rash, no sores Psych:  Not currently agitated.    Intake/Output from previous day: 06/27 0701 - 06/28 0700 In: 120 [P.O.:120] Out: -  Intake/Output this shift:    LAB RESULTS:  Recent Labs  11/23/15 1100 11/23/15 2035  WBC 16.6*  --   HGB 13.0 11.0*  HCT 38.6* 35.1*  PLT 367  --    BMET Lab Results  Component Value Date   NA 138 11/24/2015   NA 142 11/23/2015   NA 148* 01/16/2014   K 3.8 11/24/2015   K 4.2 11/23/2015   K 3.9 01/16/2014   CL 109 11/24/2015   CL 109 11/23/2015   CL 110* 01/16/2014   CO2 23 11/24/2015   CO2 27 11/23/2015   CO2 34* 01/16/2014   GLUCOSE 85 11/24/2015   GLUCOSE 115* 11/23/2015   GLUCOSE 116* 01/16/2014   BUN 14 11/24/2015   BUN 16 11/23/2015   BUN 15 01/16/2014   CREATININE 0.69 11/24/2015   CREATININE 0.80 11/23/2015   CREATININE 0.88 01/16/2014   CALCIUM 8.7* 11/24/2015   CALCIUM 9.1 11/23/2015   CALCIUM 9.3 01/16/2014   LFT  Recent Labs  11/23/15 1100 11/24/15 0350  PROT 8.0 6.9  ALBUMIN 3.7 2.9*  AST 19 18  ALT 11* <5*  ALKPHOS 63 49  BILITOT 0.2* 0.5  PT/INR Lab Results  Component Value Date   INR 1.1 01/16/2014   Hepatitis Panel No results for input(s): HEPBSAG, HCVAB, HEPAIGM, HEPBIGM in the last 72 hours. C-Diff No components found for: CDIFF Lipase     Component Value Date/Time   LIPASE 228 08/18/2013 1732    Drugs of Abuse     Component Value Date/Time   LABOPIA POSITIVE 06/21/2013 1303   COCAINSCRNUR NEGATIVE 06/21/2013 1303   LABBENZ POSITIVE 06/21/2013 1303   AMPHETMU NEGATIVE 06/21/2013 1303   THCU NEGATIVE 06/21/2013 1303   LABBARB NEGATIVE 06/21/2013 1303     RADIOLOGY STUDIES: Dg Chest Port 1 View  11/23/2015  CLINICAL DATA:  Aspiration. EXAM:  PORTABLE CHEST 1 VIEW COMPARISON:  None. FINDINGS: The heart, hila, mediastinum, lungs, and pleura are unremarkable. Mildly tortuous thoracic aorta. IMPRESSION: No active disease. Electronically Signed   By: Gerome Samavid  Williams III M.D   On: 11/23/2015 22:38    ENDOSCOPIC STUDIES: Nothing found in Epic  IMPRESSION:   *  Upper GIB with CGE and black stool.  Gastroccult +. 1/1 FOBT negative.  PTA on lower dose daily Omeprazole, no ulcer inducing meds.   *  Blood loss anemia, on po iron PTA so recent baseline unknown.  *  Dementia, Parkinson's, hx TIA/CVA.    *  Pyuria, on Rocephin.     PLAN:     *  EGD 2 PM tomorrow. Today the pt can have clear liquids.  Continue the BID IV Protonix.       Jennye MoccasinSarah Tessie Ordaz  11/24/2015, 9:22 AM Pager: [phone]

## 2015-11-24 NOTE — Care Management Note (Signed)
Case Management Note Donn PieriniKristi Anistyn Graddy RN, BSN Unit 2W-Case Manager [phone]  Patient Details  Name: Nicholas Fowler MRN: [id number] Date of Birth: 1935-07-17  Subjective/Objective:   Pt admitted with hematemesis, UTI, - IV abx                 Action/Plan: PTA pt was long term resident at [facility]- SNF- CSW following for return to SNF when medically stable.   Expected Discharge Date:                  Expected Discharge Plan:  Skilled Nursing Facility  In-House Referral:  Clinical Social Work  Discharge planning Services     Post Acute Care Choice:    Choice offered to:     DME Arranged:    DME Agency:     HH Arranged:    HH Agency:     Status of Service:  In process, will continue to follow  If discussed at Long Length of Stay Meetings, dates discussed:    Additional Comments:  Darrold SpanWebster, Desirey Keahey Hall, RN 11/24/2015, 3:23 PM

## 2015-11-24 NOTE — Progress Notes (Signed)
Pt has blood in urine and small blood clot. Md made aware.  No Orders given.will continue monitoring pt.

## 2015-11-25 ENCOUNTER — Encounter (HOSPITAL_COMMUNITY)
Admission: AD | Disposition: A | Discharge status: Skilled Nursing Facility | Payer: Self-pay | Source: Other Acute Inpatient Hospital | Attending: Internal Medicine

## 2015-11-25 DIAGNOSIS — F411 Generalized anxiety disorder: Secondary | ICD-10-CM

## 2015-11-25 DIAGNOSIS — J441 Chronic obstructive pulmonary disease with (acute) exacerbation: Secondary | ICD-10-CM

## 2015-11-25 DIAGNOSIS — N3001 Acute cystitis with hematuria: Secondary | ICD-10-CM

## 2015-11-25 DIAGNOSIS — E785 Hyperlipidemia, unspecified: Secondary | ICD-10-CM

## 2015-11-25 DIAGNOSIS — K219 Gastro-esophageal reflux disease without esophagitis: Secondary | ICD-10-CM

## 2015-11-25 DIAGNOSIS — F0390 Unspecified dementia without behavioral disturbance: Secondary | ICD-10-CM

## 2015-11-25 LAB — BASIC METABOLIC PANEL
ANION GAP: 11 (ref 5–15)
BUN: 11 mg/dL (ref 6–20)
CALCIUM: 9.1 mg/dL (ref 8.9–10.3)
CO2: 20 mmol/L — ABNORMAL LOW (ref 22–32)
Chloride: 110 mmol/L (ref 101–111)
Creatinine, Ser: 0.8 mg/dL (ref 0.61–1.24)
GFR calc Af Amer: >60 mL/min (ref >60)
GLUCOSE: 78 mg/dL (ref 65–99)
POTASSIUM: 4 mmol/L (ref 3.5–5.1)
SODIUM: 141 mmol/L (ref 135–145)

## 2015-11-25 LAB — CBC
HCT: 37 % — ABNORMAL LOW (ref 39.0–52.0)
Hemoglobin: 11.8 g/dL — ABNORMAL LOW (ref 13.0–17.0)
MCH: 29.4 pg (ref 26.0–34.0)
MCHC: 31.9 g/dL (ref 30.0–36.0)
MCV: 92 fL (ref 78.0–100.0)
PLATELETS: 286 10*3/uL (ref 150–400)
RBC: 4.02 MIL/uL — AB (ref 4.22–5.81)
RDW: 14.5 % (ref 11.5–15.5)
WBC: 8.5 10*3/uL (ref 4.0–10.5)

## 2015-11-25 LAB — URINE CULTURE: Culture: 60000 — AB

## 2015-11-25 LAB — TROPONIN I: Troponin I: 0.07 ng/mL (ref <0.03)

## 2015-11-25 SURGERY — ESOPHAGOGASTRODUODENOSCOPY (EGD) WITH PROPOFOL
Anesthesia: Monitor Anesthesia Care

## 2015-11-25 SURGERY — EGD (ESOPHAGOGASTRODUODENOSCOPY)
Anesthesia: Monitor Anesthesia Care

## 2015-11-25 MED ORDER — PANTOPRAZOLE SODIUM 40 MG PO TBEC: 40.0000 mg | DELAYED_RELEASE_TABLET | Freq: Two times a day (BID) | ORAL | Status: AC

## 2015-11-25 MED ORDER — HYDROCODONE-ACETAMINOPHEN 5-325 MG PO TABS: 1.0000 | ORAL_TABLET | ORAL | Status: AC | PRN

## 2015-11-25 MED ORDER — RESOURCE THICKENUP CLEAR PO POWD: ORAL | Status: AC

## 2015-11-25 MED ORDER — CEFUROXIME AXETIL 500 MG PO TABS: 500.0000 mg | ORAL_TABLET | Freq: Two times a day (BID) | ORAL | Status: AC

## 2015-11-25 MED ORDER — CLONAZEPAM 0.25 MG PO TBDP: 0.5000 mg | ORAL_TABLET | Freq: Three times a day (TID) | ORAL | Status: AC

## 2015-11-25 MED ORDER — PANTOPRAZOLE SODIUM 40 MG PO TBEC
40.0000 mg | DELAYED_RELEASE_TABLET | Freq: Two times a day (BID) | ORAL | Status: DC
  Administered 2015-11-25: 40 mg via ORAL
  Filled 2015-11-25: qty 1

## 2015-11-25 MED ORDER — LORAZEPAM 0.5 MG PO TABS: 1.0000 mg | ORAL_TABLET | Freq: Four times a day (QID) | ORAL | Status: AC | PRN

## 2015-11-25 NOTE — Care Management Important Message (Signed)
Important Message  Patient Details  Name: Nicholas Fowler MRN: [id number] Date of Birth: 08/19/1935   Medicare Important Message Given:  Yes    [name], [name] 11/25/2015, 10:23 AM

## 2015-11-25 NOTE — Discharge Summary (Signed)
Physician Discharge Summary  Nicholas PalmaJames E Hottle WRU:[id number] DOB: [birth year] DOA: 11/23/2015  PCP: No primary care provider on file.  Admit date: 11/23/2015 Discharge date: 11/25/2015  Time spent: 45 minutes  Recommendations for Outpatient Follow-up:  Patient will be discharged to [facility] nursing facility.  Patient will need to follow up with primary care provider within one week of discharge, repeat CBC.  Patient should continue medications as prescribed.  Patient should follow a dysphagia diet.   Discharge Diagnoses:  Hematemesis/Upper GI bleed/Blood loss anemia H/o dysphagia  UTI/hematuria Parkinson's disease/Dementia Leukocytosis Mildly elevated troponin  Discharge Condition: Stable  Diet recommendation: Dysphagia  Filed Weights   11/23/15 1712  Weight: 59.6 kg (131 lb 6.3 oz)    History of present illness:  on 11/23/2015 by Dr. Kathlen ModyVijaya Akula Nicholas PalmaJames E Antilla is a 80 y.o. male with medical history significant of parkinson's disease, dementia, copd, gerd, anxiety, fibromyalgia, presents to [facility] for hematemesis and was transferred to [facility] for further evaluation. Pt has dementia, and he can only tell me he had vomiting and family member at bedside reported that SNF reported that he had two episodes of hematemesis once last night and this am. Pt denies any other complaints.  His hemoglobin stable around 13. GI consulted for possible EGD in am. His stool for occult Blood is positive. His labs were significant for abnormal wbc count and UA is positive for nitrites and wbc.  He was admitted to telemetry .   Hospital Course:  Hematemesis/Upper GI bleed/Blood loss anemia -Poor historian, ? Coffee ground emesis -Gastroccult+, FOBT neg -Was placed on IV protonix BID -No further episodes on vomiting since admission -Gastroenterology consulted and appreciated, recommended PPI and canceled EGD -Suspect patient may have had UTI which could have caused  nausea/vomiting? -Hemoglobin currently 11.8 (was 13 upon admission, no prior records for comparison) -Repeat CBC in one week  H/o dysphagia  -on dysphagia 1 diet with nectar thick liquids. -CXR unremarkable   UTI/hematuria -Initially placed on rocephin -Urine culture: 60K colonies Proteus  -Will discharge ceftin 500mg  BID -Urine has cleared up, possible hematuria due to UTI  Parkinson's disease/Dementia -Continue home meds  Leukocytosis -probably from the UTI vs reactive -Resolved  Mildly elevated troponin -He denies chest pain, or sob -EKG does not show any ischemic changes -trops cycled and have remained flat, 0.08, 0.07  Code status: DNR  Consultants Gastroenterology  Procedures  None  Discharge Exam: Filed Vitals:   11/24/15 2101 11/25/15 0500  BP: 116/58 124/54  Pulse: 101 97  Temp: 98.2 F (36.8 C)   Resp: 18 20    Exam  General: Well developed, thin, elderly, NAD  HEENT: NCAT, mucous membranes moist.   Cardiovascular: S1 S2 auscultated, no murmurs, RRR  Respiratory: Clear to auscultation bilaterally  Abdomen: Soft, nontender, nondistended, + bowel sounds  Extremities: warm dry without cyanosis clubbing or edema  Neuro: AAOx1 (self), dementia, follows commands.  Discharge Instructions     Medication List    STOP taking these medications        omeprazole 20 MG capsule  Commonly known as:  PRILOSEC      TAKE these medications        acetaminophen 325 MG tablet  Commonly known as:  TYLENOL  Take 650 mg by mouth every 4 (four) hours as needed for moderate pain or fever.     alum & mag hydroxide-simeth 400-400-40 MG/5ML suspension  Commonly known as:  MAALOX PLUS  Take 30 mLs by mouth every  2 (two) hours as needed for indigestion.     bisacodyl 10 MG suppository  Commonly known as:  DULCOLAX  Place 10 mg rectally every 3 (three) days. (hold if resident has BM)     Carbidopa-Levodopa ER 25-100 MG tablet controlled release    Commonly known as:  SINEMET CR  Take 1 tablet by mouth 3 (three) times daily.     cefUROXime 500 MG tablet  Commonly known as:  CEFTIN  Take 1 tablet (500 mg total) by mouth 2 (two) times daily with a meal.     clonazePAM 0.25 MG disintegrating tablet  Commonly known as:  KLONOPIN  Take 2 tablets (0.5 mg total) by mouth 3 (three) times daily.     ferrous sulfate 325 (65 FE) MG tablet  Take 325 mg by mouth 3 (three) times daily with meals.     HYDROcodone-acetaminophen 5-325 MG tablet  Commonly known as:  NORCO/VICODIN  Take 1 tablet by mouth every 4 (four) hours as needed for moderate pain.     ipratropium-albuterol 0.5-2.5 (3) MG/3ML Soln  Commonly known as:  DUONEB  Take 3 mLs by nebulization as needed (for cough or congestion).     lacosamide 200 MG Tabs tablet  Commonly known as:  VIMPAT  Take 200 mg by mouth 2 (two) times daily.     lamoTRIgine 200 MG tablet  Commonly known as:  LAMICTAL  Take 200 mg by mouth 2 (two) times daily.     levothyroxine 75 MCG tablet  Commonly known as:  SYNTHROID, LEVOTHROID  Take 75 mcg by mouth daily before breakfast.     loratadine 10 MG tablet  Commonly known as:  CLARITIN  Take 10 mg by mouth daily.     LORazepam 2 MG tablet  Commonly known as:  ATIVAN  Take 2 mg by mouth every 8 (eight) hours as needed for anxiety. As needed for cluster seizure **max dose of 3 times/day** (or give IM if unable to take PO)     LORazepam 0.5 MG tablet  Commonly known as:  ATIVAN  Take 2 tablets (1 mg total) by mouth every 6 (six) hours as needed for anxiety.     Melatonin 3 MG Tabs  Take 3 mg by mouth at bedtime.     pantoprazole 40 MG tablet  Commonly known as:  PROTONIX  Take 1 tablet (40 mg total) by mouth 2 (two) times daily.     promethazine 25 MG/ML injection  Commonly known as:  PHENERGAN  Inject 12.5 mg into the vein every 6 (six) hours as needed for nausea or vomiting.     QUEtiapine 100 MG tablet  Commonly known as:  SEROQUEL   Take 200 mg by mouth daily.     QUEtiapine 400 MG tablet  Commonly known as:  SEROQUEL  Take 400 mg by mouth at bedtime.     RESOURCE THICKENUP CLEAR Powd  Use as needed to thicken liquids     ROBAFEN DM 10-100 MG/5ML liquid  Generic drug:  Dextromethorphan-Guaifenesin  Take 10 mLs by mouth every 4 (four) hours as needed (for cough and chest congestion).     senna-docusate 8.6-50 MG tablet  Commonly known as:  Senokot-S  Take 2 tablets by mouth daily.     sertraline 100 MG tablet  Commonly known as:  ZOLOFT  Take 100 mg by mouth daily.     vitamin B-12 1000 MCG tablet  Commonly known as:  CYANOCOBALAMIN  Take 1,000 mcg by mouth  daily.     Vitamin D3 50000 units Caps  Take 50,000 Units by mouth every Friday.       Allergies  Allergen Reactions  . Penicillins Other (See Comments)    Listed on [facility]      The results of significant diagnostics from this hospitalization (including imaging, microbiology, ancillary and laboratory) are listed below for reference.    Significant Diagnostic Studies: Dg Chest Port 1 View  11/23/2015  CLINICAL DATA:  Aspiration. EXAM: PORTABLE CHEST 1 VIEW COMPARISON:  None. FINDINGS: The heart, hila, mediastinum, lungs, and pleura are unremarkable. Mildly tortuous thoracic aorta. IMPRESSION: No active disease. Electronically Signed   By: Gerome Sam III M.D   On: 11/23/2015 22:38    Microbiology: Recent Results (from the past 240 hour(s))  Urine culture     Status: Abnormal   Collection Time: 11/23/15 11:34 AM  Result Value Ref Range Status   Specimen Description URINE, RANDOM  Final   Special Requests NONE  Final   Culture 60,000 COLONIES/mL PROTEUS MIRABILIS (A)  Final   Report Status 11/25/2015 FINAL  Final   Organism ID, Bacteria PROTEUS MIRABILIS (A)  Final      Susceptibility   Proteus mirabilis - MIC*    AMPICILLIN 4 SENSITIVE Sensitive     CEFAZOLIN 8 SENSITIVE Sensitive     CEFTRIAXONE <=1 SENSITIVE Sensitive      CIPROFLOXACIN >=4 RESISTANT Resistant     GENTAMICIN >=16 RESISTANT Resistant     IMIPENEM 1 SENSITIVE Sensitive     NITROFURANTOIN >=512 RESISTANT Resistant     TRIMETH/SULFA <=20 SENSITIVE Sensitive     AMPICILLIN/SULBACTAM 4 SENSITIVE Sensitive     PIP/TAZO <=4 SENSITIVE Sensitive     * 60,000 COLONIES/mL PROTEUS MIRABILIS  Culture, blood (routine x 2)     Status: None (Preliminary result)   Collection Time: 11/23/15 12:28 PM  Result Value Ref Range Status   Specimen Description BLOOD LEFT AC  Final   Special Requests   Final    BOTTLES DRAWN AEROBIC AND ANAEROBIC AER ANA   Culture NO GROWTH 2 DAYS  Final   Report Status PENDING  Incomplete  Culture, blood (routine x 2)     Status: None (Preliminary result)   Collection Time: 11/23/15 12:33 PM  Result Value Ref Range Status   Specimen Description BLOOD RIGHT AC  Final   Special Requests   Final    BOTTLES DRAWN AEROBIC AND ANAEROBIC AER ANA   Culture NO GROWTH 2 DAYS  Final   Report Status PENDING  Incomplete  MRSA PCR Screening     Status: None   Collection Time: 11/24/15  7:06 AM  Result Value Ref Range Status   MRSA by PCR NEGATIVE NEGATIVE Final    Comment:        The GeneXpert MRSA Assay (FDA approved for NASAL specimens only), is one component of a comprehensive MRSA colonization surveillance program. It is not intended to diagnose MRSA infection nor to guide or monitor treatment for MRSA infections.      Labs: Basic Metabolic Panel:  Recent Labs Lab 11/23/15 1100 11/24/15 0350 11/25/15 0410  NA 142 138 141  K 4.2 3.8 4.0  CL 109 109 110  CO2 27 23 20*  GLUCOSE 115* 85 78  BUN CREATININE 0.80 0.69 0.80  CALCIUM 9.1 8.7* 9.1   Liver Function Tests:  Recent Labs Lab 11/23/15 1100 11/24/15 0350  AST 19 18  ALT  11* <5*  ALKPHOS 63 49  BILITOT 0.2* 0.5  PROT 8.0 6.9  ALBUMIN 3.7 2.9*   No results for input(s): LIPASE, AMYLASE in the last 168 hours. No results for  input(s): AMMONIA in the last 168 hours. CBC:  Recent Labs Lab 11/23/15 1100 11/23/15 2035 11/24/15 1541 11/25/15 0410  WBC 16.6*  --   --  8.5  NEUTROABS 13.8*  --   --   --   HGB 13.0 11.0* 10.9* 11.8*  HCT 38.6* 35.1* 34.5* 37.0*  MCV 89.1  --   --  92.0  PLT 367  --   --  286   Cardiac Enzymes:  Recent Labs Lab 11/23/15 1100 11/24/15 1338 11/24/15 2020 11/25/15 0101  TROPONINI 0.08* 0.07* 0.07* 0.07*   BNP: BNP (last 3 results) No results for input(s): BNP in the last 8760 hours.  ProBNP (last 3 results) No results for input(s): PROBNP in the last 8760 hours.  CBG: No results for input(s): GLUCAP in the last 168 hours.     SignedEdsel Petrin:  Carrine Kroboth  [facility] 11/25/2015, 10:23 AM

## 2015-11-25 NOTE — Clinical Social Work Note (Signed)
Per MD patient is ready to discharge to [facility] . RN, patient, patient's family, and facility notified of discharge. RN given phone number for report and transport packet is on patient's chart. Ambulance transport requested. CSW signing off.   Valero EnergyShonny Lasean Rahming, LCSW [phone]

## 2015-11-25 NOTE — Discharge Instructions (Signed)
Aspiration Precautions °Aspiration is the breathing in (inhalation) of a liquid or object into the lungs. Things that can be inhaled into the lungs include:  °· Food. °· Any type of liquid, such as drinks or saliva. °· Stomach contents, such as vomit or stomach acid. °When these things go into the lungs, damage can occur and serious complications can result, such as: °· Lung infection (pneumonia). °· Collection of infected liquid (pus) in the lungs (lung abscess). °· Death. °CAUSES °The cause of aspiration may include:  °· A lowered level of awareness (consciousness) due to: °¨ Traumatic brain injury or head injury. °¨ Stroke. °¨ Diseases of the nerves, brain, or spinal cord. °¨ Seizures. °· A problem with the gag reflex. The gag reflex protects the body from swallowing things too quickly or things that are too large. °· Medical conditions that affect swallowing. °· Conditions that affect the food pipe (esophagus). °· Acid reflux. This is when stomach acid moves into the esophagus. °· Any type of surgery where a medicine to sleep (general anesthetic) or relax (sedative) is given. °· Alcohol abuse. °· Illegal drug abuse. °· Taking medicine that causes sleepiness, confusion, or weakness. °· Aging. °· Dental problems. °· Having a feeding tube. °SIGNS AND SYMPTOMS °Symptoms of aspiration may include:  °· Coughing after swallowing food or liquids. °· Difficulty breathing. This may include: °¨ Breathing quickly. °¨ Breathing very slowly. °¨ Loud breathing. °¨ Rumbling sounds from the lungs while breathing. °· Coughing up phlegm (sputum) that: °¨ Is yellow, tan, or green. °¨ Has pieces of food in it. °¨ Is bad smelling. °· A change in voice so that it sounds scratchy. °· A change in skin color. The skin may look red or blue.    °· Fever. °· Watery eyes. °· Pain in the chest or back. °· A pained look on the face.    °· A feeling of fullness in the throat or that something is stuck in the throat. °DIAGNOSIS °Aspiration may  be diagnosed by:  °· Chest X-ray. °· Bronchoscopy. This is a surgical procedure in which a thin, flexible tube with a camera is inserted into the nose or mouth to the lungs. The health care provider can then view the lungs. °· A swallowing evaluation study to find out: °¨ A person's risk of aspiration. °¨ How difficult it is for a person to swallow. °¨ What types of foods are safe for a person to eat. °PREVENTION °If you are caring for someone who can eat and drink through his or her mouth:  °· Have the person sit in an upright position when eating food or drinking fluids, such as: °¨ Sitting up in a chair. °¨ If sitting in a chair is not possible, position the person in bed so he or she is upright. °· Remind the person to eat slowly and chew well. °· Do not distract the person. This is especially important for people with thinking or memory (cognitive) problems. °· Check the person's mouth for leftover food after eating. °· Keep the person sitting upright for 30-45 minutes after eating. °· Do not serve food or drink for at least 2 hours before bedtime. °If you are caring for someone with a feeding tube who cannot eat or drink through his or her mouth: °· Keep the person in an upright position as much as possible. °· Do not  lay the person flat if he or she is getting continuous feedings. Turn the feeding pump off if you need to lay the person flat   for any reason. °· Check feeding tube residuals as directed by your health care provider. Ask your health care provider what residual amount is too high. °General guidelines to prevent aspiration in someone you are caring for include: °· Feed small amounts of food. Do not force feed. °· Food should be thickened as directed by the person's speech pathologist. °· Use as little water as possible when brushing the person's teeth or cleaning his or her mouth. °· Provide oral care before and after meals. °· Never put food or liquids in the mouth of a person who is not fully  alert. °· Crush pills and put them in soft food such as pudding or ice cream. Some pills should not be crushed. Check with your health care provider before crushing any medicine. °SEEK MEDICAL CARE IF: °· The person has a feeding tube and the feeding tube residual amount is too high. °· The person has a fever. °· The person tries to avoid food, such as refusing to eat or be fed, or is eating less than normal. °SEEK IMMEDIATE MEDICAL CARE IF:  °· The person has trouble breathing or starts to breathe quickly. °· The person is breathing very slowly or stops breathing. °· The person coughs a lot after eating or drinking. °· The person has a long-lasting (chronic) cough. °· The person coughs up thick, yellow, or tan sputum. °MAKE SURE YOU:  °· Understand these instructions. °· Will watch the person's condition. °· Will get help right away if the person is not doing well or gets worse. °  °This information is not intended to replace advice given to you by your health care provider. Make sure you discuss any questions you have with your health care provider. °  °Document Released: 06/17/2010 Document Revised: 06/05/2014 Document Reviewed: 08/20/2013 °Elsevier Interactive Patient Education ©2016 [employer] ° °

## 2015-11-25 NOTE — Progress Notes (Signed)
Daily Rounding Note  11/25/2015, 9:10 AM  LOS: 2 days   SUBJECTIVE:       No further n/v.  Stool yesterday and just now was brown.    OBJECTIVE:         Vital signs in last 24 hours:    Temp:  [98.2 F (36.8 C)] 98.2 F (36.8 C) (06/28 2101) Pulse Rate:  [97-101] 97 (06/29 0500) Resp:  [18-20] 20 (06/29 0500) BP: (116-124)/(54-58) 124/54 mmHg (06/29 0500) SpO2:  [100 %] 100 % (06/28 2101)   Filed Weights   11/23/15 1712  Weight: 59.6 kg (131 lb 6.3 oz)   General: demented, frail   Heart: RRR Chest: clear bil.  No SOB or cough Abdomen: soft, thin, active BS.  NT, ND.  Incontinent of greenish, brown stool which is FOBT negative  Extremities: no CCE.  + muscle wasting. Neuro/Psych:  Overall contractured posture. Moves all 4s.  Speech is unintelligible.  Does follow some commands.   Intake/Output from previous day: 06/28 0701 - 06/29 0700 In: 0  Out: 1100 [Urine:1100]  Intake/Output this shift:    Lab Results:  Recent Labs  11/23/15 1100 11/23/15 2035 11/24/15 1541 11/25/15 0410  WBC 16.6*  --   --  8.5  HGB 13.0 11.0* 10.9* 11.8*  HCT 38.6* 35.1* 34.5* 37.0*  PLT 367  --   --  286   BMET  Recent Labs  11/23/15 1100 11/24/15 0350 11/25/15 0410  NA 142 138 141  K 4.2 3.8 4.0  CL 109 109 110  CO2 27 23 20*  GLUCOSE 115* 85 78  BUN 16 14 11   CREATININE 0.80 0.69 0.80  CALCIUM 9.1 8.7* 9.1   LFT  Recent Labs  11/23/15 1100 11/24/15 0350  PROT 8.0 6.9  ALBUMIN 3.7 2.9*  AST 19 18  ALT 11* <5*  ALKPHOS 63 49  BILITOT 0.2* 0.5   PT/INR No results for input(s): LABPROT, INR in the last 72 hours. Hepatitis Panel No results for input(s): HEPBSAG, HCVAB, HEPAIGM, HEPBIGM in the last 72 hours.  Studies/Results: Dg Chest Port 1 View  11/23/2015  CLINICAL DATA:  Aspiration. EXAM: PORTABLE CHEST 1 VIEW COMPARISON:  None. FINDINGS: The heart, hila, mediastinum, lungs, and pleura are  unremarkable. Mildly tortuous thoracic aorta. IMPRESSION: No active disease. Electronically Signed   By: Gerome Samavid  Williams III M.D   On: 11/23/2015 22:38   Scheduled Meds: . Carbidopa-Levodopa ER  1 tablet Oral TID  . cefTRIAXone (ROCEPHIN)  IV  1 g Intravenous Q24H  . clonazePAM  0.5 mg Oral TID  . lacosamide (VIMPAT) IV  200 mg Intravenous Q12H  . lamoTRIgine  200 mg Oral BID  . levothyroxine  75 mcg Oral QAC breakfast  . loratadine  10 mg Oral Daily  . LORazepam  0.5 mg Intravenous Once  . Melatonin  3 mg Oral QHS  . pantoprazole (PROTONIX) IV  40 mg Intravenous Q12H  . QUEtiapine  200 mg Oral Daily  . QUEtiapine  400 mg Oral QHS  . sertraline  100 mg Oral Daily  . [START ON 11/26/2015] Vitamin D (Ergocalciferol)  50,000 Units Oral Q Fri   Continuous Infusions: . sodium chloride 75 mL/hr at 11/23/15 2101  . sodium chloride 20 mL/hr at 11/24/15 1700  . sodium chloride     PRN Meds:.acetaminophen, alum & mag hydroxide-simeth, HYDROcodone-acetaminophen, ipratropium-albuterol, LORazepam, ondansetron **OR** ondansetron (ZOFRAN) IV, RESOURCE THICKENUP CLEAR   ASSESMENT:   *  CG emesis and reports of black stool, though FOBT negative x 2.     Suspect the trigger for N/V was the UTI.  No recurrence of n/v or of black stools since arrival.  He was on oral iron PTA, this can cause black stool.    *  Normocytic anemia.  Mild.  Hgb improved without transfusion.  On po iron at SNF.    *  Advanced dementia.  Parkinsons.    *  UTI, hematuria.  0 to 5 RBC on the U/A. On rocephin.   *  Longstanding dysphagia.  Now have SNF records: diet is purees, nectar thicks.   *  Mildly elevated Troponins, 0.07.    *  DNR.     PLAN   *  Dr Lavon PaganiniNandigam has cancelled the EGD.  Will start back PO.  Thickened liquids, Puree diet. Unless there is recurrence of n/v, no plans for repeat EGD.    *  Switch to po PPI.  BID for 2 weeks, then q day.  Would up the dose of Omeprozole to 40 mg instead of 20 mg.   *   Will sign off.     Jennye MoccasinSarah Makala Fetterolf  11/25/2015, 9:10 AM Pager: [phone]

## 2015-11-25 NOTE — Clinical Social Work Note (Signed)
Clinical Social Work Assessment  Patient Details  Name: Nicholas PalmaJames E Houseman MRN: [id number] Date of Birth: 1935-09-27  Date of referral:  11/25/15               Reason for consult:  Discharge Planning                Permission sought to share information with:  Facility Medical sales representativeContact Representative, Family Supports Permission granted to share information::  Yes, Verbal Permission Granted  Name::     Vita BarleySheila Faron  Agency::  [facility]  Relationship::  Wife  Contact Information:     Housing/Transportation Living arrangements for the past 2 months:  Skilled Building surveyorursing Facility Source of Information:  Spouse Patient Interpreter Needed:  None Criminal Activity/Legal Involvement Pertinent to Current Situation/Hospitalization:  No - Comment as needed Significant Relationships:    Lives with:  Spouse Do you feel safe going back to the place where you live?  Yes Need for family participation in patient care:  No (Coment)  Care giving concerns:  Patient is a long term resident with [facility]. CSW called patient's wife. She reported patient will return to [facility].    Social Worker assessment / plan: Patient is only oriented to self. CSW called patient's wife. She reported patient is a long term resident with [facility] and the plan is for the patient to return once stable. CSW contacted Jomarie LongsJoseph with [facility] and he reported patient could return once stable. CSW will continue to follow for discharge needs.   Employment status:  Retired Health and safety inspectornsurance information:  [place] Recommendations:  Not assessed at this time Information / Referral to community resources:  Skilled Nursing Facility  Patient/Family's Response to care:  The patient's wife is happy with the care the patient has received.   Patient/Family's Understanding of and Emotional Response to Diagnosis, Current Treatment, and Prognosis:  The patient's wife has a good understanding of why the patient  was  admitted. She understands the care plan and what the patient will need post discharge.  Emotional Assessment Appearance:  Other (Comment Required (Unable to Assess) Attitude/Demeanor/Rapport:  Unable to Assess Affect (typically observed):  Accepting, Calm, Appropriate Orientation:  Oriented to Self Alcohol / Substance use:  Not Applicable Psych involvement (Current and /or in the community):  No (Comment)  Discharge Needs  Concerns to be addressed:  Discharge Planning Concerns Readmission within the last 30 days:  No Current discharge risk:  Physical Impairment Barriers to Discharge:  No Barriers Identified   Alexie Samson A Mackensi Mahadeo, LCSW 11/25/2015, 11:04 AM

## 2015-11-25 NOTE — NC FL2 (Signed)
[place] MEDICAID FL2 LEVEL OF CARE SCREENING TOOL     IDENTIFICATION  Patient Name: Nicholas Fowler Birthdate: 16-Aug-1935 Sex: male Admission Date (Current Location): 11/23/2015  [facility] [place]   Facility and Address:  [facility], [address], [place], [place] [address]      Provider Number: [id number]  Attending Physician Name and Address:  Edsel PetrinMaryann Mikhail, DO  Relative Name and Phone Number:       Current Level of Care: Hospital Recommended Level of Care: Skilled Nursing Facility Prior Approval Number:    Date Approved/Denied:   PASRR Number: [id number]  Discharge Plan: SNF    Current Diagnoses: Patient Active Problem List   Diagnosis Date Noted  . Hematemesis 11/23/2015  . COPD exacerbation (HCC) 11/23/2015  . UTI (urinary tract infection) 11/23/2015  . Dementia 11/23/2015  . GERD (gastroesophageal reflux disease) 11/23/2015  . Hyperlipidemia 11/23/2015  . Anxiety state 11/23/2015    Orientation RESPIRATION BLADDER Height & Weight     Self  Normal Incontinent Weight: 131 lb 6.3 oz (59.6 kg) Height:  5\' 5"  (165.1 cm)  BEHAVIORAL SYMPTOMS/MOOD NEUROLOGICAL BOWEL NUTRITION STATUS   (None)  (None) Continent  (DSY 1)  AMBULATORY STATUS COMMUNICATION OF NEEDS Skin   Extensive Assist Verbally Normal                       Personal Care Assistance Level of Assistance  Bathing, Feeding, Dressing Bathing Assistance: Maximum assistance Feeding assistance: Independent Dressing Assistance: Maximum assistance     Functional Limitations Info  Sight, Hearing, Speech Sight Info: Adequate Hearing Info: Adequate Speech Info: Adequate    SPECIAL CARE FACTORS FREQUENCY  PT (By licensed PT), OT (By licensed OT)     PT Frequency: 5/ week OT Frequency: 5/ week            Contractures Contractures Info: Not present    Additional Factors Info  Psychotropic, Code Status Code Status Info: DNR    Psychotropic Info: LORazepam 0.5 MG tablet Commonly known as:  ATIVAN Take 2 tablets (1 mg total) by mouth every 6 (six) hours as needed for anxiety. clonazePAM 0.25 MG disintegrating tablet Take 2 tablets (0.5 mg total) by mouth 3 (three) times daily.          Current Medications (11/25/2015):  This is the current hospital active medication list Current Facility-Administered Medications  Medication Dose Route Frequency Provider Last Rate Last Dose  . 0.9 %  sodium chloride infusion   Intravenous Continuous Kathlen ModyVijaya Akula, MD 75 mL/hr at 11/23/15 2101    . 0.9 %  sodium chloride infusion   Intravenous Continuous Napoleon FormKavitha V Nandigam, MD 20 mL/hr at 11/24/15 1700    . 0.9 %  sodium chloride infusion   Intravenous Continuous Dianah FieldSarah J Gribbin, PA-C   0  at 11/24/15 1701  . acetaminophen (TYLENOL) tablet 650 mg  650 mg Oral Q4H PRN Kathlen ModyVijaya Akula, MD      . alum & mag hydroxide-simeth (MAALOX/MYLANTA) 200-200-20 MG/5ML suspension 30 mL  30 mL Oral Q2H PRN Kathlen ModyVijaya Akula, MD      . Carbidopa-Levodopa ER (SINEMET CR) 25-100 MG tablet controlled release 1 tablet  1 tablet Oral TID Kathlen ModyVijaya Akula, MD   1 tablet at 11/25/15 1007  . cefTRIAXone (ROCEPHIN) 1 g in dextrose 5 % 50 mL IVPB  1 g Intravenous Q24H Kathlen ModyVijaya Akula, MD   1 g at 11/25/15 0012  . clonazePAM (KLONOPIN) tablet  0.5 mg  0.5 mg Oral TID Kathlen ModyVijaya Akula, MD   0.5 mg at 11/25/15 1007  . HYDROcodone-acetaminophen (NORCO/VICODIN) 5-325 MG per tablet 1 tablet  1 tablet Oral Q4H PRN Kathlen ModyVijaya Akula, MD      . ipratropium-albuterol (DUONEB) 0.5-2.5 (3) MG/3ML nebulizer solution 3 mL  3 mL Nebulization PRN Kathlen ModyVijaya Akula, MD      . lacosamide (VIMPAT) 200 mg in sodium chloride 0.9 % 25 mL IVPB  200 mg Intravenous Q12H Leda GauzeKaren J Kirby-Graham, NP   200 mg at 11/24/15 1137  . lamoTRIgine (LAMICTAL) tablet 200 mg  200 mg Oral BID Kathlen ModyVijaya Akula, MD   200 mg at 11/25/15 1007  . levothyroxine (SYNTHROID, LEVOTHROID) tablet 75 mcg  75 mcg Oral QAC breakfast Kathlen ModyVijaya Akula, MD    75 mcg at 11/24/15 0630  . loratadine (CLARITIN) tablet 10 mg  10 mg Oral Daily Kathlen ModyVijaya Akula, MD   10 mg at 11/25/15 1007  . LORazepam (ATIVAN) injection 0.5 mg  0.5 mg Intravenous Once Leda GauzeKaren J Kirby-Graham, NP   0.5 mg at 11/24/15 2315  . LORazepam (ATIVAN) tablet 1 mg  1 mg Oral Q6H PRN Kathlen ModyVijaya Akula, MD      . Melatonin TABS 3 mg  3 mg Oral QHS Kathlen ModyVijaya Akula, MD   3 mg at 11/23/15 2256  . ondansetron (ZOFRAN) tablet 4 mg  4 mg Oral Q6H PRN Kathlen ModyVijaya Akula, MD       Or  . ondansetron (ZOFRAN) injection 4 mg  4 mg Intravenous Q6H PRN Kathlen ModyVijaya Akula, MD      . pantoprazole (PROTONIX) EC tablet 40 mg  40 mg Oral BID Dianah FieldSarah J Gribbin, PA-C   40 mg at 11/25/15 1006  . QUEtiapine (SEROQUEL) tablet 200 mg  200 mg Oral Daily Kathlen ModyVijaya Akula, MD   200 mg at 11/25/15 1007  . QUEtiapine (SEROQUEL) tablet 400 mg  400 mg Oral QHS Kathlen ModyVijaya Akula, MD   400 mg at 11/23/15 2257  . RESOURCE THICKENUP CLEAR   Oral PRN Kathlen ModyVijaya Akula, MD      . sertraline (ZOLOFT) tablet 100 mg  100 mg Oral Daily Kathlen ModyVijaya Akula, MD   100 mg at 11/25/15 1007  . [START ON 11/26/2015] Vitamin D (Ergocalciferol) (DRISDOL) capsule 50,000 Units  50,000 Units Oral Q Fri Kathlen ModyVijaya Akula, MD         Discharge Medications: Please see discharge summary for a list of discharge medications.  Relevant Imaging Results:  Relevant Lab Results:   Additional Information SSN: [id number]  Reggy EyeLaShonda A Ziquan Fidel, LCSW

## 2015-11-28 LAB — CULTURE, BLOOD (ROUTINE X 2)
CULTURE: NO GROWTH
Culture: NO GROWTH

## 2016-05-08 ENCOUNTER — Other Ambulatory Visit
Admission: RE | Admit: 2016-05-08 | Discharge: 2016-05-08 | Disposition: A | Discharge status: Home / Self Care | Payer: Medicare Other | Source: Ambulatory Visit | Attending: Family Medicine | Admitting: Family Medicine

## 2016-05-08 DIAGNOSIS — F419 Anxiety disorder, unspecified: Secondary | ICD-10-CM | POA: Diagnosis present

## 2016-05-08 DIAGNOSIS — E785 Hyperlipidemia, unspecified: Secondary | ICD-10-CM | POA: Insufficient documentation

## 2016-05-08 DIAGNOSIS — J018 Other acute sinusitis: Secondary | ICD-10-CM | POA: Diagnosis present

## 2016-05-08 DIAGNOSIS — E039 Hypothyroidism, unspecified: Secondary | ICD-10-CM | POA: Diagnosis present

## 2016-05-08 DIAGNOSIS — F5101 Primary insomnia: Secondary | ICD-10-CM | POA: Diagnosis present

## 2016-05-08 DIAGNOSIS — R634 Abnormal weight loss: Secondary | ICD-10-CM | POA: Diagnosis present

## 2016-05-08 DIAGNOSIS — H05329 Deformity of unspecified orbit due to bone disease: Secondary | ICD-10-CM | POA: Insufficient documentation

## 2016-05-08 DIAGNOSIS — G40802 Other epilepsy, not intractable, without status epilepticus: Secondary | ICD-10-CM | POA: Diagnosis present

## 2016-05-08 DIAGNOSIS — J449 Chronic obstructive pulmonary disease, unspecified: Secondary | ICD-10-CM | POA: Insufficient documentation

## 2016-05-08 DIAGNOSIS — F3289 Other specified depressive episodes: Secondary | ICD-10-CM | POA: Diagnosis present

## 2016-05-08 DIAGNOSIS — R488 Other symbolic dysfunctions: Secondary | ICD-10-CM | POA: Diagnosis present

## 2016-05-08 DIAGNOSIS — F22 Delusional disorders: Secondary | ICD-10-CM | POA: Diagnosis present

## 2016-05-08 DIAGNOSIS — M818 Other osteoporosis without current pathological fracture: Secondary | ICD-10-CM | POA: Insufficient documentation

## 2016-05-08 DIAGNOSIS — G218 Other secondary parkinsonism: Secondary | ICD-10-CM | POA: Diagnosis present

## 2016-05-08 DIAGNOSIS — I69998 Other sequelae following unspecified cerebrovascular disease: Secondary | ICD-10-CM | POA: Diagnosis present

## 2016-05-08 DIAGNOSIS — G8929 Other chronic pain: Secondary | ICD-10-CM | POA: Diagnosis present

## 2016-05-08 DIAGNOSIS — D519 Vitamin B12 deficiency anemia, unspecified: Secondary | ICD-10-CM | POA: Diagnosis present

## 2016-05-08 DIAGNOSIS — K219 Gastro-esophageal reflux disease without esophagitis: Secondary | ICD-10-CM | POA: Diagnosis present

## 2016-05-08 DIAGNOSIS — I10 Essential (primary) hypertension: Secondary | ICD-10-CM | POA: Insufficient documentation

## 2016-05-08 DIAGNOSIS — Z98818 Other dental procedure status: Secondary | ICD-10-CM | POA: Diagnosis present

## 2016-05-08 DIAGNOSIS — K5900 Constipation, unspecified: Secondary | ICD-10-CM | POA: Diagnosis present

## 2016-05-08 DIAGNOSIS — R279 Unspecified lack of coordination: Secondary | ICD-10-CM | POA: Insufficient documentation

## 2016-05-08 DIAGNOSIS — Z23 Encounter for immunization: Secondary | ICD-10-CM | POA: Diagnosis present

## 2016-05-08 DIAGNOSIS — R05 Cough: Secondary | ICD-10-CM | POA: Insufficient documentation

## 2016-05-08 DIAGNOSIS — R262 Difficulty in walking, not elsewhere classified: Secondary | ICD-10-CM | POA: Insufficient documentation

## 2016-05-08 DIAGNOSIS — D649 Anemia, unspecified: Secondary | ICD-10-CM | POA: Diagnosis present

## 2016-05-08 DIAGNOSIS — F0391 Unspecified dementia with behavioral disturbance: Secondary | ICD-10-CM | POA: Insufficient documentation

## 2016-05-08 DIAGNOSIS — R112 Nausea with vomiting, unspecified: Secondary | ICD-10-CM | POA: Diagnosis present

## 2016-05-08 DIAGNOSIS — R41841 Cognitive communication deficit: Secondary | ICD-10-CM | POA: Insufficient documentation

## 2016-05-08 DIAGNOSIS — F079 Unspecified personality and behavioral disorder due to known physiological condition: Secondary | ICD-10-CM | POA: Diagnosis present

## 2016-05-08 DIAGNOSIS — M6281 Muscle weakness (generalized): Secondary | ICD-10-CM | POA: Insufficient documentation

## 2016-05-08 DIAGNOSIS — I2582 Chronic total occlusion of coronary artery: Secondary | ICD-10-CM | POA: Diagnosis present

## 2016-05-08 DIAGNOSIS — N390 Urinary tract infection, site not specified: Secondary | ICD-10-CM | POA: Insufficient documentation

## 2016-05-08 DIAGNOSIS — J309 Allergic rhinitis, unspecified: Secondary | ICD-10-CM | POA: Insufficient documentation

## 2016-05-08 DIAGNOSIS — R1312 Dysphagia, oropharyngeal phase: Secondary | ICD-10-CM | POA: Diagnosis present

## 2016-05-08 DIAGNOSIS — J189 Pneumonia, unspecified organism: Secondary | ICD-10-CM | POA: Insufficient documentation

## 2016-05-08 DIAGNOSIS — R509 Fever, unspecified: Secondary | ICD-10-CM | POA: Insufficient documentation

## 2016-05-11 LAB — LEGIONELLA PNEUMOPHILA SEROGP 1 UR AG: L. PNEUMOPHILA SEROGP 1 UR AG: NEGATIVE

## 2016-06-23 ENCOUNTER — Other Ambulatory Visit
Admission: RE | Admit: 2016-06-23 | Discharge: 2016-06-23 | Disposition: A | Discharge status: Home / Self Care | Payer: Medicare Other | Source: Skilled Nursing Facility | Attending: Family Medicine | Admitting: Family Medicine

## 2016-06-23 DIAGNOSIS — J111 Influenza due to unidentified influenza virus with other respiratory manifestations: Secondary | ICD-10-CM | POA: Diagnosis present

## 2016-06-23 LAB — INFLUENZA PANEL BY PCR (TYPE A & B)
Influenza A By PCR: NEGATIVE
Influenza B By PCR: NEGATIVE

## 2017-01-29 ENCOUNTER — Encounter: Payer: Self-pay | Admitting: Emergency Medicine

## 2017-01-29 ENCOUNTER — Emergency Department: Payer: Medicare Other

## 2017-01-29 ENCOUNTER — Emergency Department
Admission: EM | Admit: 2017-01-29 | Discharge: 2017-01-29 | Disposition: A | Discharge status: Home / Self Care | Payer: Medicare Other | Attending: Emergency Medicine | Admitting: Emergency Medicine

## 2017-01-29 DIAGNOSIS — Z8673 Personal history of transient ischemic attack (TIA), and cerebral infarction without residual deficits: Secondary | ICD-10-CM | POA: Diagnosis not present

## 2017-01-29 DIAGNOSIS — J449 Chronic obstructive pulmonary disease, unspecified: Secondary | ICD-10-CM | POA: Diagnosis not present

## 2017-01-29 DIAGNOSIS — Z87891 Personal history of nicotine dependence: Secondary | ICD-10-CM | POA: Insufficient documentation

## 2017-01-29 DIAGNOSIS — W19XXXA Unspecified fall, initial encounter: Secondary | ICD-10-CM | POA: Diagnosis not present

## 2017-01-29 DIAGNOSIS — F0390 Unspecified dementia without behavioral disturbance: Secondary | ICD-10-CM | POA: Insufficient documentation

## 2017-01-29 DIAGNOSIS — E039 Hypothyroidism, unspecified: Secondary | ICD-10-CM | POA: Diagnosis not present

## 2017-01-29 DIAGNOSIS — G20 Parkinson's disease: Secondary | ICD-10-CM | POA: Insufficient documentation

## 2017-01-29 DIAGNOSIS — Z79899 Other long term (current) drug therapy: Secondary | ICD-10-CM | POA: Diagnosis not present

## 2017-01-29 DIAGNOSIS — Y9210 Unspecified residential institution as the place of occurrence of the external cause: Secondary | ICD-10-CM | POA: Insufficient documentation

## 2017-01-29 DIAGNOSIS — Y999 Unspecified external cause status: Secondary | ICD-10-CM | POA: Insufficient documentation

## 2017-01-29 DIAGNOSIS — S0101XA Laceration without foreign body of scalp, initial encounter: Secondary | ICD-10-CM | POA: Diagnosis not present

## 2017-01-29 DIAGNOSIS — Y939 Activity, unspecified: Secondary | ICD-10-CM | POA: Insufficient documentation

## 2017-01-29 MED ORDER — LIDOCAINE HCL (PF) 1 % IJ SOLN
5.0000 mL | Freq: Once | INTRAMUSCULAR | Status: DC
  Filled 2017-01-29: qty 5

## 2017-01-29 NOTE — ED Notes (Signed)
Awake and alert to baseline.  Return to SNF via [facility].

## 2017-01-29 NOTE — ED Notes (Signed)
Pads placed on floor on either side of stretcher, non-slip socks applied and posey alarm clipped to shirt.

## 2017-01-29 NOTE — ED Provider Notes (Signed)
[facility] Emergency Department Provider Note  _______________________________________   I have reviewed the triage vital signs and the nursing notes.   HISTORY  Chief Complaint Fall   History limited by: Dementia, some history obtained from son   HPI Nicholas Fowler is a 81 y.o. male who presents to the emergency department today via EMS after a fall from living facility. Apparently the patient tried to get up and walk on his own. Son states that he does do this a lot. The patient then fell and hit his head against the side of the bed. No reported LOC. Did suffer a laceration to the back of his head. Patient not complaining of any pain however H and P limited due to his dementia.    Past Medical History:  Diagnosis Date  . Anxiety   . C. difficile diarrhea 07/2013  . Coffee ground emesis 10/2015   black stool, tested FOBT negative  . COPD (chronic obstructive pulmonary disease) (HCC)   . Coronary artery disease   . CVA (cerebral infarction) 2012  . Dementia    fairly advanced by 2015. hx combative behaviors.   Marland Kitchen. Dysphagia 07/2013  . Fibromyalgia   . GERD (gastroesophageal reflux disease)   . Hyperlipidemia   . Hypothyroidism   . Parkinson's disease (HCC)   . UTI (lower urinary tract infection) 10/2015    Patient Active Problem List   Diagnosis Date Noted  . Hematemesis 11/23/2015  . COPD exacerbation (HCC) 11/23/2015  . UTI (urinary tract infection) 11/23/2015  . Dementia 11/23/2015  . GERD (gastroesophageal reflux disease) 11/23/2015  . Hyperlipidemia 11/23/2015  . Anxiety state 11/23/2015    Past Surgical History:  Procedure Laterality Date  . BACK SURGERY  2003  . CATARACT EXTRACTION Bilateral     Prior to Admission medications   Medication Sig Start Date End Date Taking? Authorizing Provider  acetaminophen (TYLENOL) 325 MG tablet Take 650 mg by mouth every 4 (four) hours as needed for moderate pain or fever.     [provider]  alum & mag hydroxide-simeth (MAALOX PLUS) 400-400-40 MG/5ML suspension Take 30 mLs by mouth every 2 (two) hours as needed for indigestion.    [provider]  bisacodyl (DULCOLAX) 10 MG suppository Place 10 mg rectally every 3 (three) days. (hold if resident has BM)    [provider]  Carbidopa-Levodopa ER (SINEMET CR) 25-100 MG tablet controlled release Take 1 tablet by mouth 3 (three) times daily.    [provider]  cefUROXime (CEFTIN) 500 MG tablet Take 1 tablet (500 mg total) by mouth 2 (two) times daily with a meal. 11/25/15   Edsel PetrinMikhail, Maryann, DO  Cholecalciferol (VITAMIN D3) 50000 units CAPS Take 50,000 Units by mouth every Friday.     [provider]  clonazePAM (KLONOPIN) 0.25 MG disintegrating tablet Take 2 tablets (0.5 mg total) by mouth 3 (three) times daily. 11/25/15   Mikhail, Nita SellsMaryann, DO  Dextromethorphan-Guaifenesin (ROBAFEN DM) 10-100 MG/5ML liquid Take 10 mLs by mouth every 4 (four) hours as needed (for cough and chest congestion).     [provider]  ferrous sulfate 325 (65 FE) MG tablet Take 325 mg by mouth 3 (three) times daily with meals.    [provider]  HYDROcodone-acetaminophen (NORCO/VICODIN) 5-325 MG tablet Take 1 tablet by mouth every 4 (four) hours as needed for moderate pain. 11/25/15   Mikhail, Nita SellsMaryann, DO  ipratropium-albuterol (DUONEB) 0.5-2.5 (3) MG/3ML SOLN Take 3 mLs by nebulization as needed (  for cough or congestion).     [provider]  lacosamide (VIMPAT) 200 MG TABS tablet Take 200 mg by mouth 2 (two) times daily.    [provider]  lamoTRIgine (LAMICTAL) 200 MG tablet Take 200 mg by mouth 2 (two) times daily.    [provider]  levothyroxine (SYNTHROID, LEVOTHROID) 75 MCG tablet Take 75 mcg by mouth daily before breakfast.    [provider]  loratadine (CLARITIN) 10 MG tablet Take 10 mg by mouth daily.    [provider]  LORazepam (ATIVAN)  0.5 MG tablet Take 2 tablets (1 mg total) by mouth every 6 (six) hours as needed for anxiety. 11/25/15   Mikhail, Nita Sells, DO  LORazepam (ATIVAN) 2 MG tablet Take 2 mg by mouth every 8 (eight) hours as needed for anxiety. As needed for cluster seizure **max dose of 3 times/day** (or give IM if unable to take PO)    [provider]  Maltodextrin-Xanthan Gum (RESOURCE THICKENUP CLEAR) POWD Use as needed to thicken liquids 11/25/15   Edsel Petrin, DO  Melatonin 3 MG TABS Take 3 mg by mouth at bedtime.    [provider]  pantoprazole (PROTONIX) 40 MG tablet Take 1 tablet (40 mg total) by mouth 2 (two) times daily. 11/25/15   Mikhail, Nita Sells, DO  promethazine (PHENERGAN) 25 MG/ML injection Inject 12.5 mg into the vein every 6 (six) hours as needed for nausea or vomiting.    [provider]  QUEtiapine (SEROQUEL) 100 MG tablet Take 200 mg by mouth daily.     [provider]  QUEtiapine (SEROQUEL) 400 MG tablet Take 400 mg by mouth at bedtime.    [provider]  senna-docusate (SENOKOT-S) 8.6-50 MG tablet Take 2 tablets by mouth daily.    [provider]  sertraline (ZOLOFT) 100 MG tablet Take 100 mg by mouth daily.    [provider]  vitamin B-12 (CYANOCOBALAMIN) 1000 MCG tablet Take 1,000 mcg by mouth daily.    [provider]    Allergies Penicillins  No family history on file.  Social History Social History  Substance Use Topics  . Smoking status: Former Smoker    Quit date: 05/29/2008  . Smokeless tobacco: Never Used  . Alcohol use No    Review of Systems Unable to obtain secondary to dementia.  ____________________________________________   PHYSICAL EXAM:  VITAL SIGNS: ED Triage Vitals [01/29/17 1157]  Enc Vitals Group     BP 124/75     Pulse Rate 84     Resp 16     Temp 97.9 F (36.6 C)     Temp Source Oral     SpO2 98 %     Weight 140 lb (63.5 kg)   Constitutional: Awake and alert. No acute  distress.  Eyes: Conjunctivae are normal.  ENT   Head: Normocephalic.   Nose: No congestion/rhinnorhea.   Mouth/Throat: Mucous membranes are moist.   Neck: No stridor. No midline tenderness.  Hematological/Lymphatic/Immunilogical: No cervical lymphadenopathy. Cardiovascular: Normal rate, regular rhythm.  No murmurs, rubs, or gallops.  Respiratory: Normal respiratory effort without tachypnea nor retractions. Breath sounds are clear and equal bilaterally. No wheezes/rales/rhonchi. Gastrointestinal: Soft and non tender. No rebound. No guarding.  Genitourinary: Deferred Musculoskeletal: Normal range of motion in all extremities. No lower extremity edema. Neurologic:  Demented. No gross focal neurologic deficits are appreciated.  Skin:  2 cm laceration to back of scalp.  Psychiatric: Mood and affect are normal. Speech and behavior  are normal. Patient exhibits appropriate insight and judgment.  ____________________________________________    LABS (pertinent positives/negatives)  None  ____________________________________________   EKG  None  ____________________________________________    RADIOLOGY  CT head IMPRESSION:  1. Negative for bleed or other acute intracranial process.  2. Atrophy and nonspecific white matter changes.      ____________________________________________   PROCEDURES  Procedures  LACERATION REPAIR Performed by: Phineas Semen Authorized by: Phineas Semen Consent: Verbal consent obtained. Risks and benefits: risks, benefits and alternatives were discussed Consent given by: patient Patient identity confirmed: provided demographic data Prepped and Draped in normal sterile fashion Wound explored  Laceration Location: Scalp  Laceration Length: 2 cm  No Foreign Bodies seen or palpated  Anesthesia: local infiltration  Local anesthetic: lidocaine 1% without epinephrine  Anesthetic total: 2 ml  Irrigation method:  syringe Amount of cleaning: standard  Skin closure: staples  Number of staples: 3  Patient tolerance: Patient tolerated the procedure well with no immediate complications.  ____________________________________________   INITIAL IMPRESSION / ASSESSMENT AND PLAN / ED COURSE  Pertinent labs & imaging results that were available during my care of the patient were reviewed by me and considered in my medical decision making (see chart for details).  Patient presented to the emergency department today after a fall. He did suffer a small laceration to the back of his scalp. This was closed with staples. CT of the head did not show any cranial process. Patient has a history of falls. Will discharge back to living facility.  ____________________________________________   FINAL CLINICAL IMPRESSION(S) / ED DIAGNOSES  Final diagnoses:  Fall, initial encounter  Laceration of scalp, initial encounter     Note: This dictation was prepared with Dragon dictation. Any transcriptional errors that result from this process are unintentional     Phineas Semen, MD 01/29/17 1406

## 2017-01-29 NOTE — ED Triage Notes (Signed)
Arrives via [facility] f rom [facility].  Staff report that patient got up by himself to walk and fell, hitting his head on the side rails of the bed.  No LOC.  Approximate 1 in to posterior head.  Bleeding controlled..  Staff report that  Patient is at baseline mental status.

## 2017-01-29 NOTE — Discharge Instructions (Signed)
The staples will need to be removed in 7-10 days. Please seek medical attention for any high fevers, chest pain, shortness of breath, change in behavior, persistent vomiting, bloody stool or any other new or concerning symptoms. ° °

## 2017-01-29 NOTE — ED Notes (Signed)
ED Provider at bedside. 

## 2017-01-29 NOTE — ED Notes (Signed)
Pt son at bedside.

## 2017-01-29 NOTE — ED Notes (Signed)
EMS called to transport patient back to [facility].

## 2017-01-29 NOTE — ED Notes (Signed)
Return from CT Scan.  Awake and alert.  NAD 

## 2017-10-15 IMAGING — CT CT HEAD W/O CM
3 series · 15 of 47 positions shown, 18 images · non-contrast
Comparison: 01/16/2014

CLINICAL DATA: Arrives via [facility] f rom [REDACTED]. Staff report
that patient got up by himself to walk and fell, hitting his head on
the side rails of the bed. No LOC. Approximate 1 in to posterior
head. Bleeding controlled..

EXAM:
CT HEAD WITHOUT CONTRAST
TECHNIQUE: Contiguous axial images were obtained from the base of the skull
through the vertex without intravenous contrast.

[Series 3: head wo · axial · 0.41mm/px · z∈[-113,+22]mm · 9 of 33 slices shown, 12 images]
[im 3/33  brain]
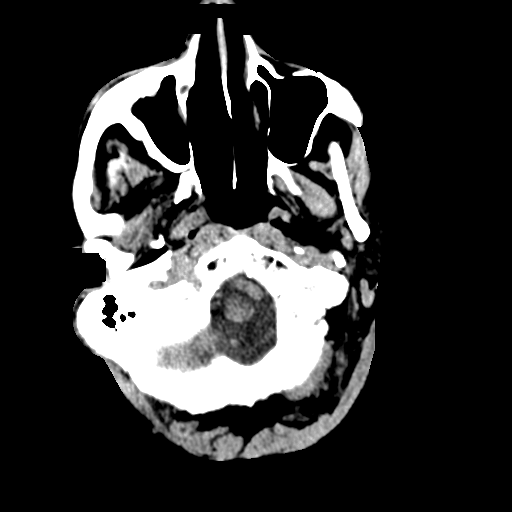
[im 3/33  bone]
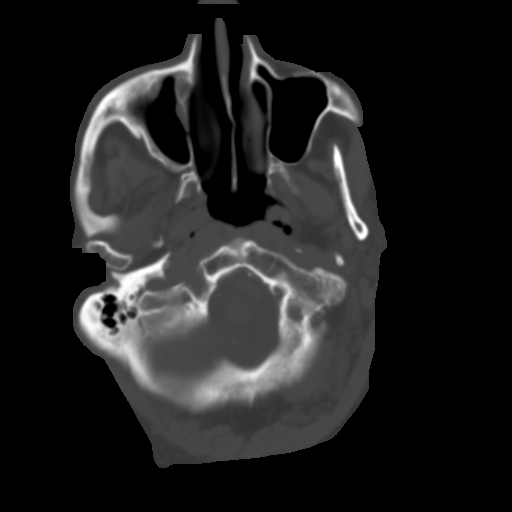
[im 6/33  brain]
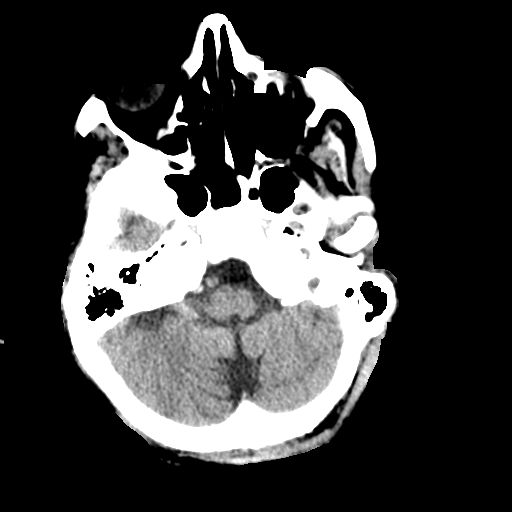
[im 9/33  brain]
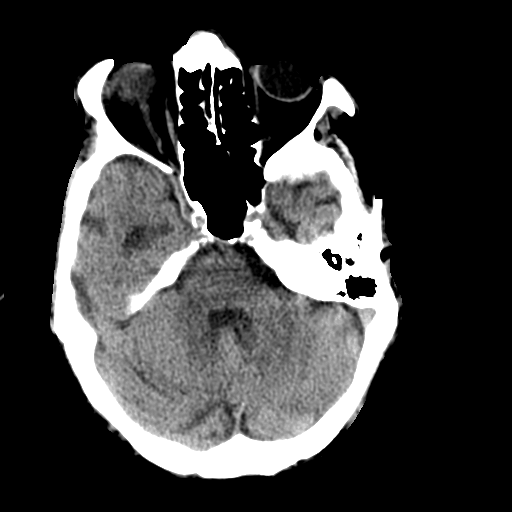
[im 13/33  brain]
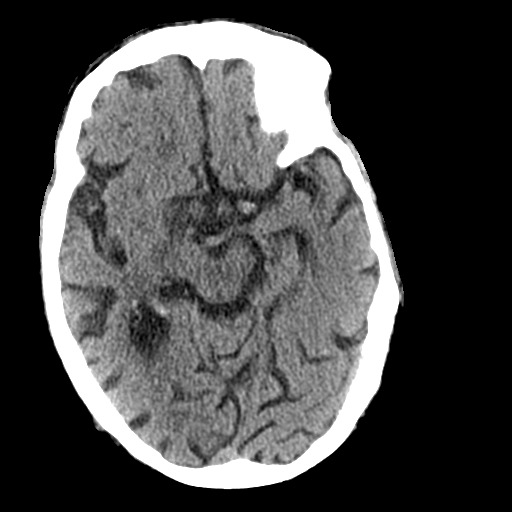
[im 17/33  brain]
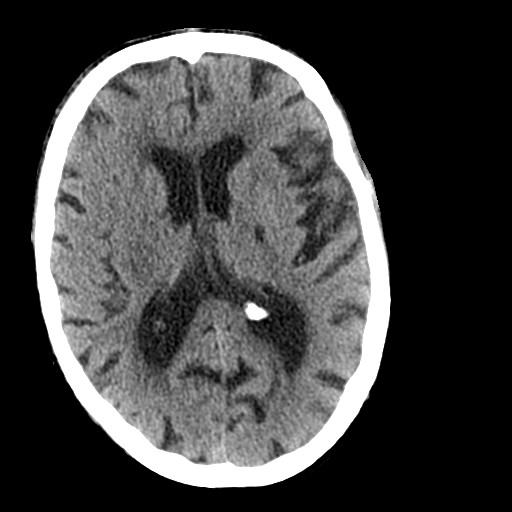
[im 17/33  bone]
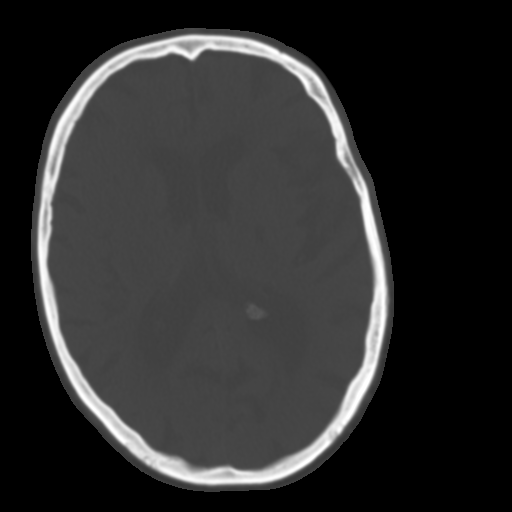
[im 20/33  brain]
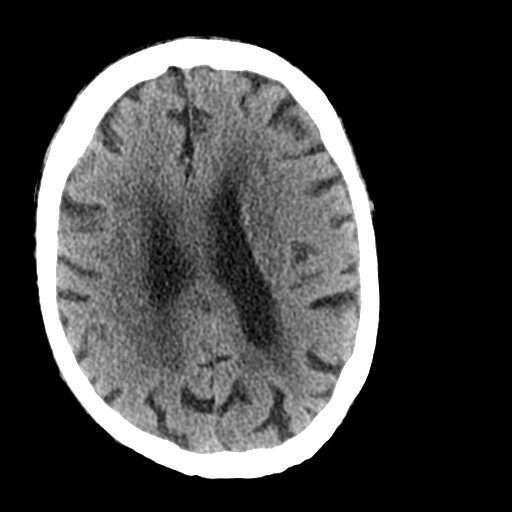
[im 24/33  brain]
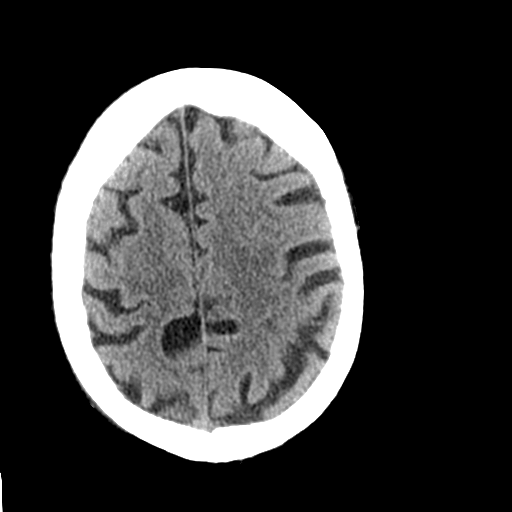
[im 27/33  brain]
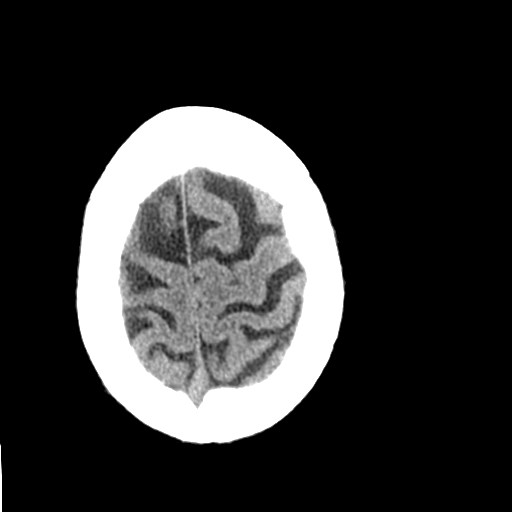
[im 30/33  brain]
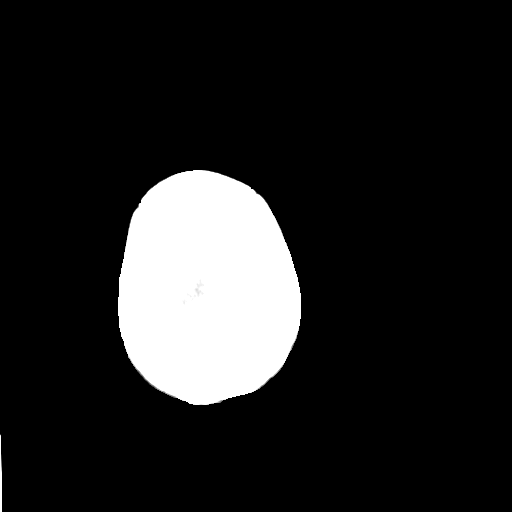
[im 30/33  bone]
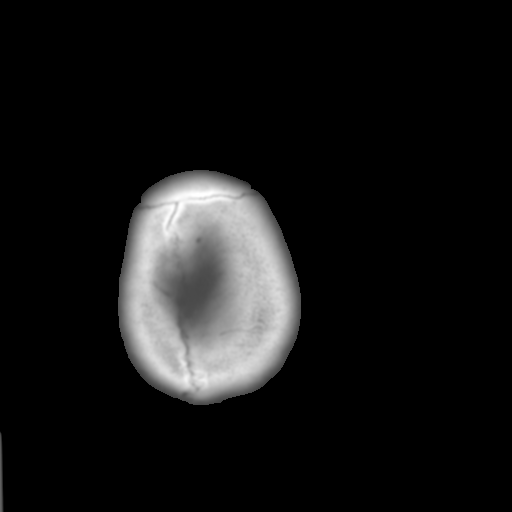

[Series 4: coronal soft tissue · coronal · 0.32mm/px · 3 of 71 slices shown]
[im 27/71  brain]
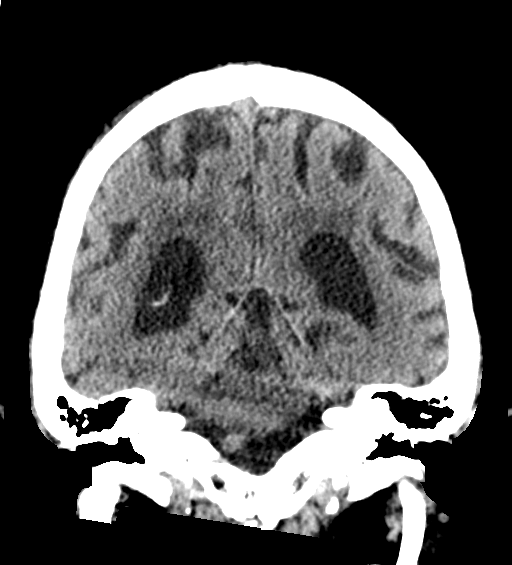
[im 33/71  brain]
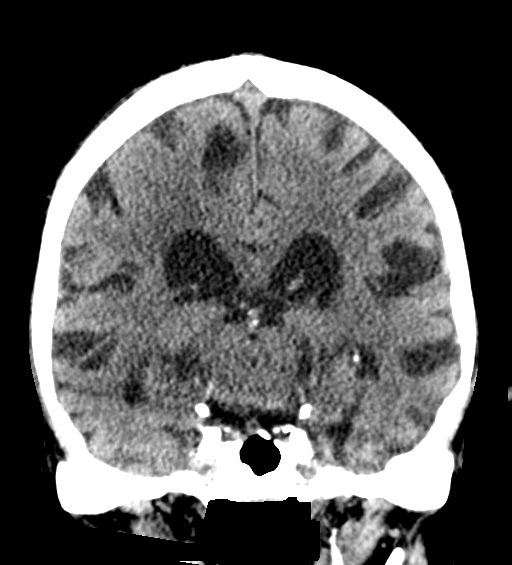
[im 39/71  brain]
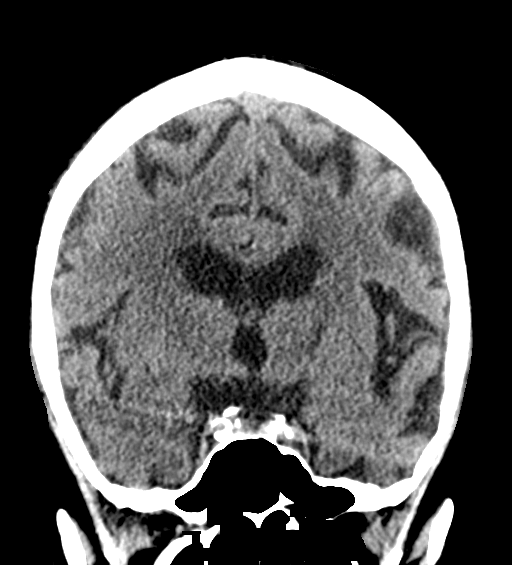

[Series 5: sagittal soft tissue · sagittal · 0.38mm/px · 3 of 58 slices shown]
[im 22/58  brain]
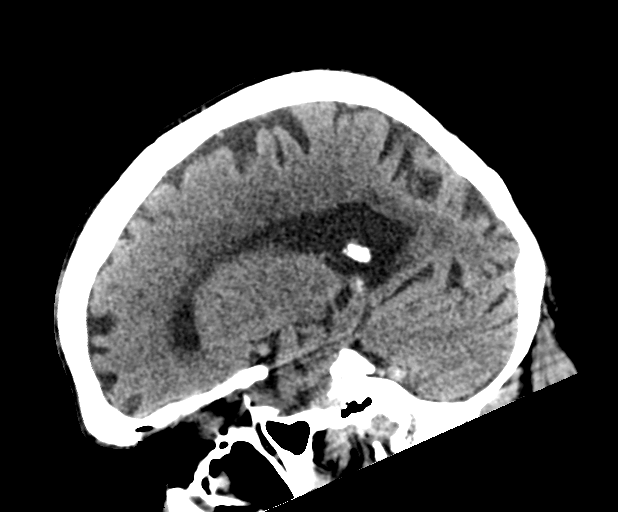
[im 29/58  brain]
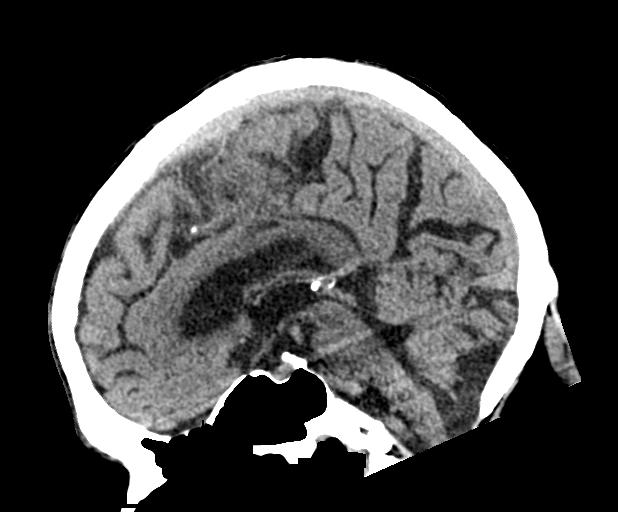
[im 36/58  brain]
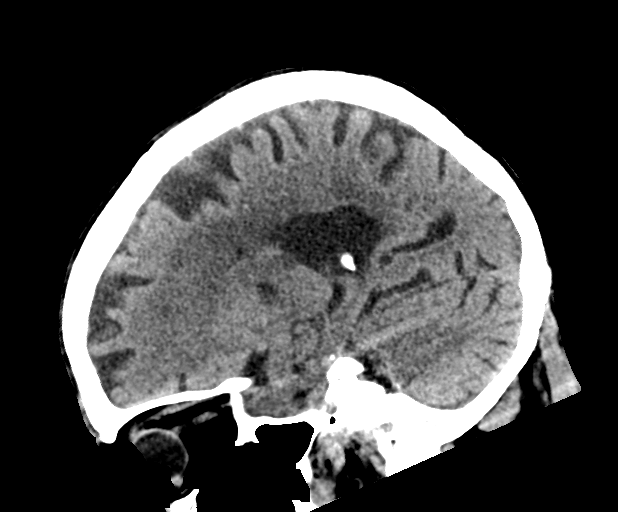

[15 of 47 positions shown; findings below may reference images not displayed]

FINDINGS: Brain: Diffuse parenchymal atrophy. Patchy areas of hypoattenuation
in deep and periventricular white matter bilaterally. Negative for
acute intracranial hemorrhage, mass lesion, acute infarction,
midline shift, or mass-effect. Stable lacunar infarct in the left
thalamus. Acute infarct may be inapparent on noncontrast CT.
Ventricles and sulci symmetric.

Vascular: Atherosclerotic and physiologic intracranial
calcifications.

Skull: Normal. Negative for fracture or focal lesion.

Sinuses/Orbits: No acute finding.

Other: None.
IMPRESSION: 1. Negative for bleed or other acute intracranial process.
2. Atrophy and nonspecific white matter changes.

## 2018-05-29 DEATH — deceased
